# Patient Record
Sex: Female | Born: 1965
Health system: Southern US, Community
[De-identification: ages and names within clinical notes are randomized; demographics above are authoritative.]

## PROBLEM LIST (undated history)

## (undated) DIAGNOSIS — T8859XA Other complications of anesthesia, initial encounter: Secondary | ICD-10-CM

## (undated) DIAGNOSIS — Z9889 Other specified postprocedural states: Secondary | ICD-10-CM

## (undated) DIAGNOSIS — I10 Essential (primary) hypertension: Secondary | ICD-10-CM

## (undated) DIAGNOSIS — T4145XA Adverse effect of unspecified anesthetic, initial encounter: Secondary | ICD-10-CM

## (undated) DIAGNOSIS — F410 Panic disorder [episodic paroxysmal anxiety] without agoraphobia: Secondary | ICD-10-CM

## (undated) DIAGNOSIS — J449 Chronic obstructive pulmonary disease, unspecified: Secondary | ICD-10-CM

## (undated) DIAGNOSIS — R112 Nausea with vomiting, unspecified: Secondary | ICD-10-CM

## (undated) DIAGNOSIS — F419 Anxiety disorder, unspecified: Secondary | ICD-10-CM

## (undated) DIAGNOSIS — M1990 Unspecified osteoarthritis, unspecified site: Secondary | ICD-10-CM

## (undated) DIAGNOSIS — I499 Cardiac arrhythmia, unspecified: Secondary | ICD-10-CM

## (undated) HISTORY — DX: Unspecified osteoarthritis, unspecified site: M19.90

## (undated) HISTORY — PX: ABDOMINAL HYSTERECTOMY: SHX81

## (undated) HISTORY — PX: TONSILLECTOMY: SUR1361

## (undated) HISTORY — DX: Essential (primary) hypertension: I10

## (undated) HISTORY — PX: NECK SURGERY: SHX720

## (undated) HISTORY — PX: BACK SURGERY: SHX140

## (undated) HISTORY — DX: Anxiety disorder, unspecified: F41.9

---
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
## 2004-06-26 ENCOUNTER — Inpatient Hospital Stay: Payer: Self-pay | Admitting: Unknown Physician Specialty

## 2005-10-21 ENCOUNTER — Ambulatory Visit: Payer: Self-pay | Admitting: Unknown Physician Specialty

## 2008-06-21 ENCOUNTER — Ambulatory Visit: Payer: Self-pay | Admitting: Internal Medicine

## 2008-06-25 ENCOUNTER — Emergency Department: Payer: Self-pay | Admitting: Emergency Medicine

## 2008-08-01 ENCOUNTER — Ambulatory Visit: Payer: Self-pay

## 2008-08-22 ENCOUNTER — Ambulatory Visit: Payer: Self-pay | Admitting: Unknown Physician Specialty

## 2008-08-29 ENCOUNTER — Inpatient Hospital Stay: Payer: Self-pay | Admitting: Unknown Physician Specialty

## 2009-03-23 ENCOUNTER — Emergency Department: Payer: Self-pay | Admitting: Unknown Physician Specialty

## 2009-07-18 ENCOUNTER — Ambulatory Visit: Payer: Self-pay

## 2012-04-05 ENCOUNTER — Ambulatory Visit: Payer: Self-pay | Admitting: Obstetrics and Gynecology

## 2012-04-15 ENCOUNTER — Ambulatory Visit: Payer: Self-pay | Admitting: Obstetrics and Gynecology

## 2014-04-12 ENCOUNTER — Ambulatory Visit: Payer: Self-pay | Admitting: Unknown Physician Specialty

## 2014-06-06 ENCOUNTER — Encounter: Payer: Self-pay | Admitting: Surgery

## 2014-06-09 LAB — WOUND AEROBIC CULTURE

## 2014-06-19 ENCOUNTER — Encounter: Payer: Self-pay | Admitting: Surgery

## 2014-11-06 NOTE — Op Note (Signed)
PATIENT NAME:  Rachel Morse, Rachel Morse MR#:  [id number] DATE OF BIRTH:  05-05-1966  DATE OF PROCEDURE:  04/15/2012  PREOPERATIVE DIAGNOSIS: Vaginal inclusion cyst.   POSTOPERATIVE DIAGNOSIS: Vaginal inclusion cyst.   OPERATION PERFORMED: Excision of right vaginal inclusion cyst.   ANESTHESIA USED: General.   SURGEON: Stoney Bang. Georgianne Fick, MD    ESTIMATED BLOOD LOSS: Minimal.   OPERATIVE FLUIDS: 100 mL of Crystalloid.   URINE OUTPUT: None.   ANESTHESIA: General.   ESTIMATED BLOOD LOSS: Minimal.   INTRAOPERATIVE FINDINGS: An approximately 1 cm right vaginal inclusion cyst was noted at the level of the hymenal ring. The inclusion cyst upon incision was noted to contain a small amount of  caseous material which was removed. The cyst wall was excised as well.   SPECIMEN TYPE: Vaginal cyst and cyst material.   COMPLICATIONS: None.   PATIENT CONDITION FOLLOWING THE PROCEDURE: Stable.   PROCEDURE IN DETAIL: Risks, benefits, and alternatives of the procedure were discussed with the patient prior to proceeding to the OR. The patient was taken to the operating room and placed under general endotracheal anesthesia. She was then positioned in the dorsal lithotomy position, prepped and draped in the usual sterile fashion. Following a time-out procedure, attention was turned to the right vaginal sidewall which was able to be visualized by simply retracting the right labia. A stab incision was made above the vaginal inclusion cyst. The cyst material was then shelled out and the vaginal cyst wall was grasped with Adson forceps and also excised using the scalpel. Following this, a single figure-of-eight suture of 2-0 chromic was placed to close the deep layer of the subcutaneous tissues. The skin was then closed using a 4-0 Monocryl in a subcuticular fashion. Sponge, needle, and instrument counts were correct x2. The patient tolerated the procedure well and was taken to the recovery room in stable condition.    ____________________________ Stoney Bang. Georgianne Fick, MD ams:drc D: 04/16/2012 06:50:21 ET T: 04/16/2012 12:04:11 ET JOB#: [id number]  cc: Stoney Bang. Georgianne Fick, MD, <Dictator> Dorthula Nettles MD ELECTRONICALLY SIGNED 04/28/2012 21:02

## 2016-02-24 ENCOUNTER — Ambulatory Visit: Payer: Medicare Other | Admitting: Pain Medicine

## 2016-03-18 ENCOUNTER — Ambulatory Visit: Payer: Medicare Other | Attending: Pain Medicine | Admitting: Pain Medicine

## 2016-03-18 ENCOUNTER — Encounter: Payer: Self-pay | Admitting: Pain Medicine

## 2016-03-18 ENCOUNTER — Telehealth: Payer: Self-pay

## 2016-03-18 VITALS — BP 125/62 | HR 86 | Temp 98.4°F | Resp 20 | Ht 67.0 in | Wt 320.0 lb

## 2016-03-18 DIAGNOSIS — M25559 Pain in unspecified hip: Secondary | ICD-10-CM

## 2016-03-18 DIAGNOSIS — M25562 Pain in left knee: Secondary | ICD-10-CM | POA: Diagnosis not present

## 2016-03-18 DIAGNOSIS — M539 Dorsopathy, unspecified: Secondary | ICD-10-CM

## 2016-03-18 DIAGNOSIS — Z6841 Body Mass Index (BMI) 40.0 and over, adult: Secondary | ICD-10-CM | POA: Diagnosis not present

## 2016-03-18 DIAGNOSIS — F410 Panic disorder [episodic paroxysmal anxiety] without agoraphobia: Secondary | ICD-10-CM

## 2016-03-18 DIAGNOSIS — M25551 Pain in right hip: Secondary | ICD-10-CM | POA: Diagnosis present

## 2016-03-18 DIAGNOSIS — R200 Anesthesia of skin: Secondary | ICD-10-CM | POA: Diagnosis not present

## 2016-03-18 DIAGNOSIS — F1190 Opioid use, unspecified, uncomplicated: Secondary | ICD-10-CM | POA: Diagnosis not present

## 2016-03-18 DIAGNOSIS — M542 Cervicalgia: Secondary | ICD-10-CM | POA: Insufficient documentation

## 2016-03-18 DIAGNOSIS — G8929 Other chronic pain: Secondary | ICD-10-CM | POA: Insufficient documentation

## 2016-03-18 DIAGNOSIS — F411 Generalized anxiety disorder: Secondary | ICD-10-CM

## 2016-03-18 DIAGNOSIS — M25571 Pain in right ankle and joints of right foot: Secondary | ICD-10-CM | POA: Diagnosis not present

## 2016-03-18 DIAGNOSIS — Z79899 Other long term (current) drug therapy: Secondary | ICD-10-CM | POA: Diagnosis not present

## 2016-03-18 DIAGNOSIS — M25552 Pain in left hip: Secondary | ICD-10-CM | POA: Diagnosis present

## 2016-03-18 DIAGNOSIS — M25511 Pain in right shoulder: Secondary | ICD-10-CM | POA: Diagnosis not present

## 2016-03-18 DIAGNOSIS — Z90710 Acquired absence of both cervix and uterus: Secondary | ICD-10-CM | POA: Insufficient documentation

## 2016-03-18 DIAGNOSIS — M79671 Pain in right foot: Secondary | ICD-10-CM | POA: Insufficient documentation

## 2016-03-18 DIAGNOSIS — M47816 Spondylosis without myelopathy or radiculopathy, lumbar region: Secondary | ICD-10-CM | POA: Insufficient documentation

## 2016-03-18 DIAGNOSIS — M5441 Lumbago with sciatica, right side: Secondary | ICD-10-CM

## 2016-03-18 DIAGNOSIS — M25512 Pain in left shoulder: Secondary | ICD-10-CM | POA: Diagnosis not present

## 2016-03-18 DIAGNOSIS — Z0189 Encounter for other specified special examinations: Secondary | ICD-10-CM

## 2016-03-18 DIAGNOSIS — Z79891 Long term (current) use of opiate analgesic: Secondary | ICD-10-CM

## 2016-03-18 DIAGNOSIS — M25572 Pain in left ankle and joints of left foot: Secondary | ICD-10-CM | POA: Insufficient documentation

## 2016-03-18 DIAGNOSIS — M25579 Pain in unspecified ankle and joints of unspecified foot: Secondary | ICD-10-CM

## 2016-03-18 DIAGNOSIS — M533 Sacrococcygeal disorders, not elsewhere classified: Secondary | ICD-10-CM

## 2016-03-18 DIAGNOSIS — M79605 Pain in left leg: Secondary | ICD-10-CM | POA: Diagnosis not present

## 2016-03-18 DIAGNOSIS — R209 Unspecified disturbances of skin sensation: Secondary | ICD-10-CM | POA: Insufficient documentation

## 2016-03-18 DIAGNOSIS — M159 Polyosteoarthritis, unspecified: Secondary | ICD-10-CM | POA: Insufficient documentation

## 2016-03-18 DIAGNOSIS — M545 Low back pain, unspecified: Secondary | ICD-10-CM

## 2016-03-18 DIAGNOSIS — Z1211 Encounter for screening for malignant neoplasm of colon: Secondary | ICD-10-CM | POA: Insufficient documentation

## 2016-03-18 DIAGNOSIS — F17210 Nicotine dependence, cigarettes, uncomplicated: Secondary | ICD-10-CM | POA: Insufficient documentation

## 2016-03-18 DIAGNOSIS — M5442 Lumbago with sciatica, left side: Secondary | ICD-10-CM

## 2016-03-18 DIAGNOSIS — Z981 Arthrodesis status: Secondary | ICD-10-CM | POA: Diagnosis not present

## 2016-03-18 DIAGNOSIS — M79672 Pain in left foot: Secondary | ICD-10-CM | POA: Insufficient documentation

## 2016-03-18 DIAGNOSIS — Y990 Civilian activity done for income or pay: Secondary | ICD-10-CM | POA: Insufficient documentation

## 2016-03-18 DIAGNOSIS — M4802 Spinal stenosis, cervical region: Secondary | ICD-10-CM | POA: Insufficient documentation

## 2016-03-18 DIAGNOSIS — M25561 Pain in right knee: Secondary | ICD-10-CM | POA: Insufficient documentation

## 2016-03-18 DIAGNOSIS — M150 Primary generalized (osteo)arthritis: Secondary | ICD-10-CM

## 2016-03-18 DIAGNOSIS — M47812 Spondylosis without myelopathy or radiculopathy, cervical region: Secondary | ICD-10-CM

## 2016-03-18 DIAGNOSIS — R937 Abnormal findings on diagnostic imaging of other parts of musculoskeletal system: Secondary | ICD-10-CM

## 2016-03-18 DIAGNOSIS — M79604 Pain in right leg: Secondary | ICD-10-CM | POA: Diagnosis not present

## 2016-03-18 DIAGNOSIS — I10 Essential (primary) hypertension: Secondary | ICD-10-CM | POA: Insufficient documentation

## 2016-03-18 DIAGNOSIS — F4310 Post-traumatic stress disorder, unspecified: Secondary | ICD-10-CM

## 2016-03-18 DIAGNOSIS — M79606 Pain in leg, unspecified: Secondary | ICD-10-CM

## 2016-03-18 DIAGNOSIS — R0602 Shortness of breath: Secondary | ICD-10-CM

## 2016-03-18 DIAGNOSIS — Z5181 Encounter for therapeutic drug level monitoring: Secondary | ICD-10-CM

## 2016-03-18 DIAGNOSIS — Z9889 Other specified postprocedural states: Secondary | ICD-10-CM

## 2016-03-18 DIAGNOSIS — M79673 Pain in unspecified foot: Secondary | ICD-10-CM

## 2016-03-18 DIAGNOSIS — R5381 Other malaise: Secondary | ICD-10-CM

## 2016-03-18 DIAGNOSIS — Z87828 Personal history of other (healed) physical injury and trauma: Secondary | ICD-10-CM

## 2016-03-18 DIAGNOSIS — F329 Major depressive disorder, single episode, unspecified: Secondary | ICD-10-CM | POA: Insufficient documentation

## 2016-03-18 DIAGNOSIS — M961 Postlaminectomy syndrome, not elsewhere classified: Secondary | ICD-10-CM | POA: Insufficient documentation

## 2016-03-18 DIAGNOSIS — M5030 Other cervical disc degeneration, unspecified cervical region: Secondary | ICD-10-CM | POA: Insufficient documentation

## 2016-03-18 NOTE — Telephone Encounter (Signed)
[name] from [facility] [phone] wants to know a plan for pt. And get office visit notes once available.

## 2016-03-18 NOTE — Telephone Encounter (Signed)
Explained Dr. Adalberto Cole procedure for prescribing narcotics to new patients to staff at [facility]. Notes faxed.

## 2016-03-18 NOTE — Progress Notes (Signed)
Safety precautions to be maintained throughout the outpatient stay will include: orient to surroundings, keep bed in low position, maintain call bell within reach at all times, provide assistance with transfer out of bed and ambulation.  
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
## 2016-03-18 NOTE — Patient Instructions (Signed)
INSTRUCTED TO GET XRAYS AND LABS DRAWN AT THE [facility] TODAY OR ASAP.

## 2016-03-31 ENCOUNTER — Encounter: Payer: Self-pay | Admitting: Pain Medicine

## 2016-03-31 DIAGNOSIS — F1290 Cannabis use, unspecified, uncomplicated: Secondary | ICD-10-CM | POA: Insufficient documentation

## 2016-03-31 LAB — COMPLIANCE DRUG ANALYSIS, UR

## 2016-03-31 NOTE — Progress Notes (Signed)
NOTE: This forensic urine drug screen (UDS) test was conducted using a state-of-the-art ultra high performance liquid chromatography and mass spectrometry system (UPLC/MS-MS), the most sophisticated and accurate method available. UPLC/MS-MS is 1,000 times more precise and accurate than standard gas chromatography and mass spectrometry (GC/MS). This system can analyze 26 drug categories and 180 drug compounds.  This patient's UDT results were (+) for Cannabinoids. The patient has been informed of our "Zero Tolerance" towards the use of any illegal substances. Because the patient did disclose the use of cannabinoids at the time of collection, we will not discontinue the posibility of using of controlled substances, but we will limit them contingent on the patient's discontinuation of all illegal substances. We will also limit the interval between refills to no more that 30 days, with frequent unanounced UDTs. As long as the quantitative levels are seen to be decreasing, we will continue therapy. The minute they increase or remain the same, we will discontinue the opioid management option.

## 2016-05-07 ENCOUNTER — Telehealth: Payer: Self-pay

## 2016-05-07 NOTE — Telephone Encounter (Signed)
Patient called and instructed that she needs to have [facility] call us and confirm appt and medications.

## 2016-05-07 NOTE — Telephone Encounter (Signed)
She has new patient appt on 08-10-16.. Can you call [facility] and let them know that she is not getting medicine here at least until then, so they will keep filling her pain meds till she gets in here.

## 2016-06-17 ENCOUNTER — Ambulatory Visit: Payer: Medicare Other | Admitting: Pain Medicine

## 2016-08-10 ENCOUNTER — Ambulatory Visit: Payer: Medicare Other | Admitting: Pain Medicine

## 2016-08-10 DIAGNOSIS — G894 Chronic pain syndrome: Secondary | ICD-10-CM | POA: Insufficient documentation
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
## 2016-08-11 ENCOUNTER — Ambulatory Visit: Payer: Medicare Other | Attending: Pain Medicine | Admitting: Pain Medicine

## 2016-08-11 ENCOUNTER — Encounter (INDEPENDENT_AMBULATORY_CARE_PROVIDER_SITE_OTHER): Payer: Self-pay

## 2016-08-11 ENCOUNTER — Encounter: Payer: Self-pay | Admitting: Pain Medicine

## 2016-08-11 VITALS — BP 157/94 | HR 74 | Temp 98.4°F | Resp 18 | Ht 67.0 in | Wt 320.0 lb

## 2016-08-11 DIAGNOSIS — M79605 Pain in left leg: Secondary | ICD-10-CM

## 2016-08-11 DIAGNOSIS — G894 Chronic pain syndrome: Secondary | ICD-10-CM | POA: Diagnosis not present

## 2016-08-11 DIAGNOSIS — M542 Cervicalgia: Secondary | ICD-10-CM | POA: Diagnosis not present

## 2016-08-11 DIAGNOSIS — M25511 Pain in right shoulder: Secondary | ICD-10-CM | POA: Insufficient documentation

## 2016-08-11 DIAGNOSIS — Z79891 Long term (current) use of opiate analgesic: Secondary | ICD-10-CM | POA: Diagnosis not present

## 2016-08-11 DIAGNOSIS — M159 Polyosteoarthritis, unspecified: Secondary | ICD-10-CM

## 2016-08-11 DIAGNOSIS — M79604 Pain in right leg: Secondary | ICD-10-CM

## 2016-08-11 DIAGNOSIS — M1991 Primary osteoarthritis, unspecified site: Secondary | ICD-10-CM | POA: Insufficient documentation

## 2016-08-11 DIAGNOSIS — M47816 Spondylosis without myelopathy or radiculopathy, lumbar region: Secondary | ICD-10-CM

## 2016-08-11 DIAGNOSIS — M25561 Pain in right knee: Secondary | ICD-10-CM | POA: Insufficient documentation

## 2016-08-11 DIAGNOSIS — Z5189 Encounter for other specified aftercare: Secondary | ICD-10-CM | POA: Diagnosis present

## 2016-08-11 DIAGNOSIS — Z9889 Other specified postprocedural states: Secondary | ICD-10-CM

## 2016-08-11 DIAGNOSIS — M25562 Pain in left knee: Secondary | ICD-10-CM | POA: Insufficient documentation

## 2016-08-11 DIAGNOSIS — M961 Postlaminectomy syndrome, not elsewhere classified: Secondary | ICD-10-CM

## 2016-08-11 DIAGNOSIS — F1290 Cannabis use, unspecified, uncomplicated: Secondary | ICD-10-CM

## 2016-08-11 DIAGNOSIS — M25512 Pain in left shoulder: Secondary | ICD-10-CM | POA: Diagnosis not present

## 2016-08-11 DIAGNOSIS — M1288 Other specific arthropathies, not elsewhere classified, other specified site: Secondary | ICD-10-CM

## 2016-08-11 DIAGNOSIS — F1190 Opioid use, unspecified, uncomplicated: Secondary | ICD-10-CM

## 2016-08-11 DIAGNOSIS — M5442 Lumbago with sciatica, left side: Secondary | ICD-10-CM | POA: Diagnosis not present

## 2016-08-11 DIAGNOSIS — M25559 Pain in unspecified hip: Secondary | ICD-10-CM

## 2016-08-11 DIAGNOSIS — F1390 Sedative, hypnotic, or anxiolytic use, unspecified, uncomplicated: Secondary | ICD-10-CM | POA: Diagnosis not present

## 2016-08-11 DIAGNOSIS — M533 Sacrococcygeal disorders, not elsewhere classified: Secondary | ICD-10-CM | POA: Diagnosis not present

## 2016-08-11 DIAGNOSIS — M150 Primary generalized (osteo)arthritis: Secondary | ICD-10-CM

## 2016-08-11 DIAGNOSIS — Z79899 Other long term (current) drug therapy: Secondary | ICD-10-CM

## 2016-08-11 DIAGNOSIS — M5441 Lumbago with sciatica, right side: Secondary | ICD-10-CM

## 2016-08-11 DIAGNOSIS — G8929 Other chronic pain: Secondary | ICD-10-CM

## 2017-02-25 ENCOUNTER — Other Ambulatory Visit: Payer: Self-pay | Admitting: Nurse Practitioner

## 2017-02-25 DIAGNOSIS — Z1239 Encounter for other screening for malignant neoplasm of breast: Secondary | ICD-10-CM

## 2017-12-28 ENCOUNTER — Emergency Department
Admission: EM | Admit: 2017-12-28 | Discharge: 2017-12-28 | Disposition: A | Discharge status: Home / Self Care | Payer: Medicare Other | Attending: Emergency Medicine | Admitting: Emergency Medicine

## 2017-12-28 ENCOUNTER — Other Ambulatory Visit: Payer: Self-pay

## 2017-12-28 ENCOUNTER — Emergency Department: Payer: Medicare Other

## 2017-12-28 ENCOUNTER — Encounter: Payer: Self-pay | Admitting: Emergency Medicine

## 2017-12-28 DIAGNOSIS — I10 Essential (primary) hypertension: Secondary | ICD-10-CM | POA: Insufficient documentation

## 2017-12-28 DIAGNOSIS — R0789 Other chest pain: Secondary | ICD-10-CM | POA: Insufficient documentation

## 2017-12-28 DIAGNOSIS — R0981 Nasal congestion: Secondary | ICD-10-CM | POA: Diagnosis not present

## 2017-12-28 DIAGNOSIS — R05 Cough: Secondary | ICD-10-CM

## 2017-12-28 DIAGNOSIS — J449 Chronic obstructive pulmonary disease, unspecified: Secondary | ICD-10-CM | POA: Insufficient documentation

## 2017-12-28 DIAGNOSIS — R0602 Shortness of breath: Secondary | ICD-10-CM | POA: Diagnosis present

## 2017-12-28 DIAGNOSIS — J189 Pneumonia, unspecified organism: Secondary | ICD-10-CM

## 2017-12-28 DIAGNOSIS — F17210 Nicotine dependence, cigarettes, uncomplicated: Secondary | ICD-10-CM | POA: Diagnosis not present

## 2017-12-28 DIAGNOSIS — R059 Cough, unspecified: Secondary | ICD-10-CM

## 2017-12-28 LAB — BASIC METABOLIC PANEL
ANION GAP: 10 (ref 5–15)
BUN: 14 mg/dL (ref 6–20)
CHLORIDE: 102 mmol/L (ref 101–111)
CO2: 25 mmol/L (ref 22–32)
Calcium: 9.4 mg/dL (ref 8.9–10.3)
Creatinine, Ser: 0.85 mg/dL (ref 0.44–1.00)
GFR calc non Af Amer: >60 mL/min (ref >60)
Glucose, Bld: 116 mg/dL — ABNORMAL HIGH (ref 65–99)
Potassium: 3.6 mmol/L (ref 3.5–5.1)
Sodium: 137 mmol/L (ref 135–145)

## 2017-12-28 LAB — CBC
HEMATOCRIT: 44.7 % (ref 35.0–47.0)
HEMOGLOBIN: 15.6 g/dL (ref 12.0–16.0)
MCH: 31.7 pg (ref 26.0–34.0)
MCHC: 34.8 g/dL (ref 32.0–36.0)
MCV: 91.1 fL (ref 80.0–100.0)
Platelets: 325 10*3/uL (ref 150–440)
RBC: 4.9 MIL/uL (ref 3.80–5.20)
RDW: 13.9 % (ref 11.5–14.5)
WBC: 15 10*3/uL — ABNORMAL HIGH (ref 3.6–11.0)

## 2017-12-28 LAB — TROPONIN I: Troponin I: <0.03 ng/mL (ref <0.03)

## 2017-12-28 MED ORDER — DOXYCYCLINE HYCLATE 100 MG PO CAPS: ORAL_CAPSULE | ORAL | 0 refills | Status: DC

## 2017-12-28 MED ORDER — IPRATROPIUM-ALBUTEROL 0.5-2.5 (3) MG/3ML IN SOLN
3.0000 mL | Freq: Once | RESPIRATORY_TRACT | Status: AC
  Administered 2017-12-28: 3 mL via RESPIRATORY_TRACT
  Filled 2017-12-28: qty 3

## 2017-12-28 MED ORDER — ALBUTEROL SULFATE (2.5 MG/3ML) 0.083% IN NEBU
5.0000 mg | INHALATION_SOLUTION | Freq: Once | RESPIRATORY_TRACT | Status: AC
  Administered 2017-12-28: 5 mg via RESPIRATORY_TRACT
  Filled 2017-12-28: qty 6

## 2017-12-28 MED ORDER — DOXYCYCLINE HYCLATE 100 MG PO TABS
100.0000 mg | ORAL_TABLET | Freq: Once | ORAL | Status: AC
  Administered 2017-12-28: 100 mg via ORAL
  Filled 2017-12-28: qty 1

## 2017-12-28 MED ORDER — ACETAMINOPHEN 325 MG PO TABS: 650.0000 mg | ORAL_TABLET | Freq: Once | ORAL | Status: DC | PRN

## 2017-12-28 MED ORDER — ALBUTEROL SULFATE HFA 108 (90 BASE) MCG/ACT IN AERS: INHALATION_SPRAY | RESPIRATORY_TRACT | 1 refills | Status: AC

## 2017-12-28 MED ORDER — DEXAMETHASONE 10 MG/ML FOR PEDIATRIC ORAL USE
10.0000 mg | Freq: Once | INTRAMUSCULAR | Status: AC
  Administered 2017-12-28: 10 mg via ORAL
  Filled 2017-12-28: qty 1

## 2017-12-28 MED ORDER — DEXAMETHASONE SODIUM PHOSPHATE 10 MG/ML IJ SOLN
INTRAMUSCULAR | Status: AC
  Administered 2017-12-28: 10 mg via ORAL
  Filled 2017-12-28: qty 1

## 2017-12-28 MED ORDER — HYDROCODONE-HOMATROPINE 5-1.5 MG/5ML PO SYRP: 5.0000 mL | ORAL_SOLUTION | Freq: Four times a day (QID) | ORAL | 0 refills | Status: DC | PRN

## 2017-12-28 NOTE — ED Triage Notes (Signed)
Patient presents to the ED with shortness of breath x 1 week with cough and congestion.  Patient states prior to cough she was having nausea and vomiting with diarrhea, that ended approx. 4 days ago.  Patient states, "at home, I felt like, If I kept laying there, I was going to die."  Patient is sitting calmly in triage.  Respirations even and not labored at this time.
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
## 2017-12-28 NOTE — Discharge Instructions (Signed)

## 2018-07-11 ENCOUNTER — Emergency Department: Payer: Medicare Other

## 2018-07-11 ENCOUNTER — Other Ambulatory Visit: Payer: Self-pay

## 2018-07-11 ENCOUNTER — Emergency Department
Admission: EM | Admit: 2018-07-11 | Discharge: 2018-07-11 | Disposition: A | Discharge status: Home / Self Care | Payer: Medicare Other | Attending: Emergency Medicine | Admitting: Emergency Medicine

## 2018-07-11 DIAGNOSIS — Y998 Other external cause status: Secondary | ICD-10-CM | POA: Insufficient documentation

## 2018-07-11 DIAGNOSIS — S99921A Unspecified injury of right foot, initial encounter: Secondary | ICD-10-CM | POA: Diagnosis present

## 2018-07-11 DIAGNOSIS — S9031XA Contusion of right foot, initial encounter: Secondary | ICD-10-CM | POA: Insufficient documentation

## 2018-07-11 DIAGNOSIS — I10 Essential (primary) hypertension: Secondary | ICD-10-CM | POA: Diagnosis not present

## 2018-07-11 DIAGNOSIS — Y929 Unspecified place or not applicable: Secondary | ICD-10-CM | POA: Diagnosis not present

## 2018-07-11 DIAGNOSIS — W208XXA Other cause of strike by thrown, projected or falling object, initial encounter: Secondary | ICD-10-CM | POA: Insufficient documentation

## 2018-07-11 DIAGNOSIS — F17210 Nicotine dependence, cigarettes, uncomplicated: Secondary | ICD-10-CM | POA: Insufficient documentation

## 2018-07-11 DIAGNOSIS — Y9389 Activity, other specified: Secondary | ICD-10-CM | POA: Insufficient documentation

## 2018-07-11 MED ORDER — MELOXICAM 15 MG PO TABS: 15.0000 mg | ORAL_TABLET | Freq: Every day | ORAL | 0 refills | Status: DC

## 2018-07-11 MED ORDER — OXYCODONE-ACETAMINOPHEN 5-325 MG PO TABS
1.0000 | ORAL_TABLET | Freq: Once | ORAL | Status: AC
  Administered 2018-07-11: 1 via ORAL
  Filled 2018-07-11: qty 1

## 2018-07-11 MED ORDER — ONDANSETRON 8 MG PO TBDP
8.0000 mg | ORAL_TABLET | Freq: Once | ORAL | Status: AC
  Administered 2018-07-11: 8 mg via ORAL
  Filled 2018-07-11: qty 1
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
## 2018-07-11 NOTE — ED Notes (Signed)
Large hematoma noted to top of R foot; pulse 2+.

## 2018-07-11 NOTE — ED Notes (Signed)
[place] Engineer, structural at bedside to talk with pt.

## 2019-01-08 ENCOUNTER — Other Ambulatory Visit: Payer: Self-pay

## 2019-01-08 ENCOUNTER — Encounter: Payer: Self-pay | Admitting: *Deleted

## 2019-01-08 ENCOUNTER — Emergency Department: Payer: Medicare Other

## 2019-01-08 ENCOUNTER — Emergency Department
Admission: EM | Admit: 2019-01-08 | Discharge: 2019-01-08 | Disposition: A | Discharge status: Home / Self Care | Payer: Medicare Other | Attending: Emergency Medicine | Admitting: Emergency Medicine

## 2019-01-08 DIAGNOSIS — F17210 Nicotine dependence, cigarettes, uncomplicated: Secondary | ICD-10-CM | POA: Insufficient documentation

## 2019-01-08 DIAGNOSIS — I10 Essential (primary) hypertension: Secondary | ICD-10-CM | POA: Diagnosis not present

## 2019-01-08 DIAGNOSIS — M5412 Radiculopathy, cervical region: Secondary | ICD-10-CM | POA: Insufficient documentation

## 2019-01-08 DIAGNOSIS — M542 Cervicalgia: Secondary | ICD-10-CM | POA: Diagnosis present

## 2019-01-08 LAB — BASIC METABOLIC PANEL
Anion gap: 11 (ref 5–15)
BUN: 19 mg/dL (ref 6–20)
CO2: 25 mmol/L (ref 22–32)
Calcium: 9 mg/dL (ref 8.9–10.3)
Chloride: 101 mmol/L (ref 98–111)
Creatinine, Ser: 0.91 mg/dL (ref 0.44–1.00)
GFR calc Af Amer: >60 mL/min (ref >60)
GFR calc non Af Amer: >60 mL/min (ref >60)
Glucose, Bld: 120 mg/dL — ABNORMAL HIGH (ref 70–99)
Potassium: 3.4 mmol/L — ABNORMAL LOW (ref 3.5–5.1)
Sodium: 137 mmol/L (ref 135–145)

## 2019-01-08 LAB — CBC
HCT: 44.4 % (ref 36.0–46.0)
Hemoglobin: 14.8 g/dL (ref 12.0–15.0)
MCH: 29.8 pg (ref 26.0–34.0)
MCHC: 33.3 g/dL (ref 30.0–36.0)
MCV: 89.3 fL (ref 80.0–100.0)
Platelets: 285 10*3/uL (ref 150–400)
RBC: 4.97 MIL/uL (ref 3.87–5.11)
RDW: 12.8 % (ref 11.5–15.5)
WBC: 10.7 10*3/uL — ABNORMAL HIGH (ref 4.0–10.5)
nRBC: 0 % (ref 0.0–0.2)

## 2019-01-08 LAB — TROPONIN I: Troponin I: <0.03 ng/mL (ref <0.03)

## 2019-01-08 MED ORDER — BACLOFEN 10 MG PO TABS: 10.0000 mg | ORAL_TABLET | Freq: Three times a day (TID) | ORAL | 0 refills | Status: DC

## 2019-01-08 MED ORDER — PREDNISONE 10 MG (21) PO TBPK: ORAL_TABLET | ORAL | 0 refills | Status: DC

## 2019-01-08 MED ORDER — HYDROMORPHONE HCL 1 MG/ML IJ SOLN
1.0000 mg | Freq: Once | INTRAMUSCULAR | Status: AC
  Administered 2019-01-08: 1 mg via INTRAVENOUS
  Filled 2019-01-08: qty 1

## 2019-01-08 MED ORDER — SODIUM CHLORIDE 0.9% FLUSH: 3.0000 mL | Freq: Once | INTRAVENOUS | Status: DC

## 2019-01-08 NOTE — ED Triage Notes (Signed)
First Nurse Note:  C/O right sided neck, shoulder and arm pain x 1 week with fingers feeling numb at times.  AAOx3.  Skin warm and dry. MAE equally and strong.  NAD

## 2019-01-08 NOTE — ED Notes (Signed)
Pt verbalized understanding of discharge instructions. NAD at this time. 

## 2019-01-08 NOTE — ED Provider Notes (Signed)
Patient seen and examined by me.  Clinically she likely has a cervical radiculopathy involving the right arm.  She is very tender in and around the right neck musculature.  Anticipate discharge with outpatient referral.   Earleen Newport, MD 01/08/19 1728

## 2019-01-08 NOTE — Discharge Instructions (Addendum)
Follow-up with your regular doctor or Dr. Lacinda Axon at [facility] for evaluation.  Take medication as prescribed.  Return emergency department worsening.

## 2019-01-08 NOTE — ED Triage Notes (Signed)
Pt to ED reporting right sided neck, shoulder and arm pain with intermittent numbness in her right hand and fingers. Pt able to move right side without difficulty but reports movement increases the pain slightly. No injury recently.
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
## 2019-01-18 ENCOUNTER — Other Ambulatory Visit: Payer: Self-pay | Admitting: Student

## 2019-01-18 DIAGNOSIS — M5412 Radiculopathy, cervical region: Secondary | ICD-10-CM

## 2019-01-30 ENCOUNTER — Ambulatory Visit
Admission: RE | Admit: 2019-01-30 | Discharge: 2019-01-30 | Disposition: A | Discharge status: Home / Self Care | Payer: Medicare Other | Source: Ambulatory Visit | Attending: Student | Admitting: Student

## 2019-01-30 ENCOUNTER — Other Ambulatory Visit: Payer: Self-pay

## 2019-01-30 DIAGNOSIS — M5412 Radiculopathy, cervical region: Secondary | ICD-10-CM | POA: Insufficient documentation

## 2019-01-31 ENCOUNTER — Other Ambulatory Visit: Payer: Self-pay

## 2019-01-31 ENCOUNTER — Encounter: Payer: Self-pay | Admitting: Emergency Medicine

## 2019-01-31 DIAGNOSIS — Z5321 Procedure and treatment not carried out due to patient leaving prior to being seen by health care provider: Secondary | ICD-10-CM | POA: Insufficient documentation

## 2019-01-31 DIAGNOSIS — M542 Cervicalgia: Secondary | ICD-10-CM | POA: Diagnosis present

## 2019-01-31 NOTE — ED Triage Notes (Signed)
Patient ambulatory to triage with steady gait, without difficulty or distress noted, mask in place; pt reports she has been dx with pinched nerve in neck; had MRI yesterday; c/o persistent pain to rt side neck with movement that radiates down arm and into fingers; denies any accomp symptoms; taking gabapentin & percocet without relief

## 2019-02-01 ENCOUNTER — Emergency Department
Admission: EM | Admit: 2019-02-01 | Discharge: 2019-02-01 | Discharge status: Against Medical Advice | Payer: Medicare Other | Attending: Emergency Medicine | Admitting: Emergency Medicine

## 2019-02-01 NOTE — ED Notes (Signed)
No answer when called several times from lobby 

## 2019-03-01 ENCOUNTER — Other Ambulatory Visit: Payer: Self-pay | Admitting: Neurosurgery

## 2019-03-08 ENCOUNTER — Encounter
Admission: RE | Admit: 2019-03-08 | Discharge: 2019-03-08 | Disposition: A | Discharge status: Home / Self Care | Payer: Medicare Other | Source: Ambulatory Visit | Attending: Neurosurgery | Admitting: Neurosurgery

## 2019-03-08 ENCOUNTER — Other Ambulatory Visit: Payer: Self-pay

## 2019-03-08 DIAGNOSIS — Z01812 Encounter for preprocedural laboratory examination: Secondary | ICD-10-CM | POA: Diagnosis not present

## 2019-03-08 DIAGNOSIS — Z20828 Contact with and (suspected) exposure to other viral communicable diseases: Secondary | ICD-10-CM | POA: Insufficient documentation

## 2019-03-08 HISTORY — DX: Other complications of anesthesia, initial encounter: T88.59XA

## 2019-03-08 HISTORY — DX: Nausea with vomiting, unspecified: R11.2

## 2019-03-08 HISTORY — DX: Chronic obstructive pulmonary disease, unspecified: J44.9

## 2019-03-08 HISTORY — DX: Other specified postprocedural states: Z98.890

## 2019-03-08 HISTORY — DX: Panic disorder (episodic paroxysmal anxiety): F41.0

## 2019-03-08 HISTORY — DX: Cardiac arrhythmia, unspecified: I49.9

## 2019-03-08 LAB — BASIC METABOLIC PANEL
Anion gap: 11 (ref 5–15)
BUN: 25 mg/dL — ABNORMAL HIGH (ref 6–20)
CO2: 22 mmol/L (ref 22–32)
Calcium: 9.1 mg/dL (ref 8.9–10.3)
Chloride: 102 mmol/L (ref 98–111)
Creatinine, Ser: 0.99 mg/dL (ref 0.44–1.00)
GFR calc Af Amer: >60 mL/min (ref >60)
GFR calc non Af Amer: >60 mL/min (ref >60)
Glucose, Bld: 105 mg/dL — ABNORMAL HIGH (ref 70–99)
Potassium: 3.9 mmol/L (ref 3.5–5.1)
Sodium: 135 mmol/L (ref 135–145)

## 2019-03-08 LAB — CBC
HCT: 43.4 % (ref 36.0–46.0)
Hemoglobin: 14.6 g/dL (ref 12.0–15.0)
MCH: 30.5 pg (ref 26.0–34.0)
MCHC: 33.6 g/dL (ref 30.0–36.0)
MCV: 90.6 fL (ref 80.0–100.0)
Platelets: 287 10*3/uL (ref 150–400)
RBC: 4.79 MIL/uL (ref 3.87–5.11)
RDW: 12.9 % (ref 11.5–15.5)
WBC: 9 10*3/uL (ref 4.0–10.5)
nRBC: 0 % (ref 0.0–0.2)

## 2019-03-08 LAB — PROTIME-INR
INR: 1.1 (ref 0.8–1.2)
Prothrombin Time: 13.9 seconds (ref 11.4–15.2)

## 2019-03-08 LAB — TYPE AND SCREEN
ABO/RH(D): O POS
Antibody Screen: NEGATIVE

## 2019-03-08 LAB — URINALYSIS, ROUTINE W REFLEX MICROSCOPIC
Bilirubin Urine: NEGATIVE
Glucose, UA: NEGATIVE mg/dL
Ketones, ur: NEGATIVE mg/dL
Nitrite: POSITIVE — AB
Protein, ur: NEGATIVE mg/dL
Specific Gravity, Urine: 1.018 (ref 1.005–1.030)
WBC, UA: >50 WBC/hpf — ABNORMAL HIGH (ref 0–5)
pH: 5 (ref 5.0–8.0)

## 2019-03-08 LAB — SURGICAL PCR SCREEN
MRSA, PCR: NEGATIVE
Staphylococcus aureus: POSITIVE — AB

## 2019-03-08 LAB — APTT: aPTT: 34 seconds (ref 24–36)

## 2019-03-08 NOTE — Patient Instructions (Signed)
Your procedure is scheduled on: 03/13/19 Report to Day Surgery.[facility]OND FLOOR To find out your arrival time please call [phone] between 1PM - 3PM on 03/10/19.  Remember: Instructions that are not followed completely may result in serious medical risk,  up to and including death, or upon the discretion of your surgeon and anesthesiologist your  surgery may need to be rescheduled.     _X__ 1. Do not eat food after midnight the night before your procedure.                 No gum chewing or hard candies. You may drink clear liquids up to 2 hours                 before you are scheduled to arrive for your surgery- DO not drink clear                 liquids within 2 hours of the start of your surgery.                 Clear Liquids include:  water, apple juice without pulp, clear carbohydrate                 drink such as Clearfast of Gatorade, Black Coffee or Tea (Do not add                 anything to coffee or tea).  __X__2.  On the morning of surgery brush your teeth with toothpaste and water, you                may rinse your mouth with mouthwash if you wish.  Do not swallow any toothpaste of mouthwash.     _X__ 3.  No Alcohol for 24 hours before or after surgery.   _X__ 4.  Do Not Smoke or use e-cigarettes For 24 Hours Prior to Your Surgery.                 Do not use any chewable tobacco products for at least 6 hours prior to                 surgery.  ____  5.  Bring all medications with you on the day of surgery if instructed.   _X___  6.  Notify your doctor if there is any change in your medical condition      (cold, fever, infections).     Do not wear jewelry, make-up, hairpins, clips or nail polish. Do not wear lotions, powders, or perfumes. You may wear deodorant. Do not shave 48 hours prior to surgery. Men may shave face and neck. Do not bring valuables to the hospital.    [facility] is not responsible for any belongings or  valuables.  Contacts, dentures or bridgework may not be worn into surgery. Leave your suitcase in the car. After surgery it may be brought to your room. For patients admitted to the hospital, discharge time is determined by your treatment team.   Patients discharged the day of surgery will not be allowed to drive home.   Please read over the following fact sheets that you were given:   Surgical Site Infection Prevention / MRSA/SPINAL BOOKLET         _X___ Take these medicines the morning of surgery with A SIP OF WATER:    1. ALPRAZOLAM  2. AMLODIPINE  3. ABILIFY  4.LEVOTHYROXINE  5.  6.  ____ Fleet Enema (as directed)  __X__ Use CHG Soap as directed  __X__ Use inhalers on the day of surgery AND BRING  ____ Stop metformin 2 days prior to surgery    ____ Take 1/2 of usual insulin dose the night before surgery. No insulin the morning          of surgery.   ____ Stop Coumadin/Plavix/aspirin on   ____ Stop Anti-inflammatories on    ____ Stop supplements until after surgery.    ____ Bring C-Pap to the hospital.

## 2019-03-09 ENCOUNTER — Other Ambulatory Visit
Admission: RE | Admit: 2019-03-09 | Discharge: 2019-03-09 | Disposition: A | Discharge status: Home / Self Care | Payer: Medicare Other | Source: Ambulatory Visit | Attending: Neurosurgery | Admitting: Neurosurgery

## 2019-03-09 DIAGNOSIS — Z01812 Encounter for preprocedural laboratory examination: Secondary | ICD-10-CM | POA: Diagnosis not present

## 2019-03-09 LAB — SARS CORONAVIRUS 2 (TAT 6-24 HRS): SARS Coronavirus 2: NEGATIVE

## 2019-03-13 ENCOUNTER — Ambulatory Visit: Payer: Medicare Other | Admitting: Certified Registered"

## 2019-03-13 ENCOUNTER — Observation Stay
Admission: RE | Admit: 2019-03-13 | Discharge: 2019-03-14 | Disposition: A | Discharge status: Home / Self Care | Payer: Medicare Other | Attending: Neurosurgery | Admitting: Neurosurgery

## 2019-03-13 ENCOUNTER — Encounter: Payer: Self-pay | Admitting: *Deleted

## 2019-03-13 ENCOUNTER — Other Ambulatory Visit: Payer: Self-pay

## 2019-03-13 ENCOUNTER — Encounter
Admission: RE | Disposition: A | Discharge status: Home / Self Care | Payer: Self-pay | Source: Home / Self Care | Attending: Neurosurgery

## 2019-03-13 ENCOUNTER — Ambulatory Visit: Payer: Medicare Other

## 2019-03-13 DIAGNOSIS — Z79899 Other long term (current) drug therapy: Secondary | ICD-10-CM | POA: Diagnosis not present

## 2019-03-13 DIAGNOSIS — Z981 Arthrodesis status: Secondary | ICD-10-CM

## 2019-03-13 DIAGNOSIS — Z91048 Other nonmedicinal substance allergy status: Secondary | ICD-10-CM | POA: Diagnosis not present

## 2019-03-13 DIAGNOSIS — Z91041 Radiographic dye allergy status: Secondary | ICD-10-CM | POA: Insufficient documentation

## 2019-03-13 DIAGNOSIS — Z825 Family history of asthma and other chronic lower respiratory diseases: Secondary | ICD-10-CM | POA: Insufficient documentation

## 2019-03-13 DIAGNOSIS — I10 Essential (primary) hypertension: Secondary | ICD-10-CM | POA: Insufficient documentation

## 2019-03-13 DIAGNOSIS — J449 Chronic obstructive pulmonary disease, unspecified: Secondary | ICD-10-CM | POA: Insufficient documentation

## 2019-03-13 DIAGNOSIS — Z7951 Long term (current) use of inhaled steroids: Secondary | ICD-10-CM | POA: Diagnosis not present

## 2019-03-13 DIAGNOSIS — F410 Panic disorder [episodic paroxysmal anxiety] without agoraphobia: Secondary | ICD-10-CM | POA: Insufficient documentation

## 2019-03-13 DIAGNOSIS — Z823 Family history of stroke: Secondary | ICD-10-CM | POA: Diagnosis not present

## 2019-03-13 DIAGNOSIS — E669 Obesity, unspecified: Secondary | ICD-10-CM | POA: Insufficient documentation

## 2019-03-13 DIAGNOSIS — M1990 Unspecified osteoarthritis, unspecified site: Secondary | ICD-10-CM | POA: Insufficient documentation

## 2019-03-13 DIAGNOSIS — Z419 Encounter for procedure for purposes other than remedying health state, unspecified: Secondary | ICD-10-CM

## 2019-03-13 DIAGNOSIS — M2578 Osteophyte, vertebrae: Secondary | ICD-10-CM | POA: Diagnosis not present

## 2019-03-13 DIAGNOSIS — M50122 Cervical disc disorder at C5-C6 level with radiculopathy: Principal | ICD-10-CM | POA: Insufficient documentation

## 2019-03-13 DIAGNOSIS — Z8249 Family history of ischemic heart disease and other diseases of the circulatory system: Secondary | ICD-10-CM | POA: Insufficient documentation

## 2019-03-13 DIAGNOSIS — Z87891 Personal history of nicotine dependence: Secondary | ICD-10-CM | POA: Insufficient documentation

## 2019-03-13 DIAGNOSIS — Z90710 Acquired absence of both cervix and uterus: Secondary | ICD-10-CM | POA: Diagnosis not present

## 2019-03-13 DIAGNOSIS — Z6841 Body Mass Index (BMI) 40.0 and over, adult: Secondary | ICD-10-CM | POA: Diagnosis not present

## 2019-03-13 DIAGNOSIS — F419 Anxiety disorder, unspecified: Secondary | ICD-10-CM | POA: Diagnosis not present

## 2019-03-13 DIAGNOSIS — M5412 Radiculopathy, cervical region: Secondary | ICD-10-CM | POA: Diagnosis present

## 2019-03-13 HISTORY — PX: ANTERIOR CERVICAL DECOMP/DISCECTOMY FUSION: SHX1161

## 2019-03-13 LAB — ABO/RH: ABO/RH(D): O POS

## 2019-03-13 SURGERY — ANTERIOR CERVICAL DECOMPRESSION/DISCECTOMY FUSION 1 LEVEL
Anesthesia: General | Site: Neck

## 2019-03-13 MED ORDER — MIDAZOLAM HCL 2 MG/2ML IJ SOLN
INTRAMUSCULAR | Status: AC
  Filled 2019-03-13: qty 2

## 2019-03-13 MED ORDER — EPHEDRINE SULFATE 50 MG/ML IJ SOLN
INTRAMUSCULAR | Status: DC | PRN
  Administered 2019-03-13 (×3): 5 mg via INTRAVENOUS

## 2019-03-13 MED ORDER — GLYCOPYRROLATE 0.2 MG/ML IJ SOLN
INTRAMUSCULAR | Status: DC | PRN
  Administered 2019-03-13: 0.2 mg via INTRAVENOUS

## 2019-03-13 MED ORDER — REMIFENTANIL HCL 1 MG IV SOLR
INTRAVENOUS | Status: AC
  Filled 2019-03-13: qty 1000

## 2019-03-13 MED ORDER — SODIUM CHLORIDE 0.9 % IV SOLN
INTRAVENOUS | Status: DC | PRN
  Administered 2019-03-13: .1 ug/kg/min via INTRAVENOUS
  Administered 2019-03-13: 10:00:00 via INTRAVENOUS

## 2019-03-13 MED ORDER — SUCCINYLCHOLINE CHLORIDE 20 MG/ML IJ SOLN
INTRAMUSCULAR | Status: AC
  Filled 2019-03-13: qty 1

## 2019-03-13 MED ORDER — ALPRAZOLAM 0.5 MG PO TABS
0.5000 mg | ORAL_TABLET | Freq: Three times a day (TID) | ORAL | Status: DC
  Administered 2019-03-13 – 2019-03-14 (×3): 0.5 mg via ORAL
  Filled 2019-03-13 (×3): qty 1

## 2019-03-13 MED ORDER — HYDROCHLOROTHIAZIDE 12.5 MG PO CAPS
12.5000 mg | ORAL_CAPSULE | Freq: Every day | ORAL | Status: DC
  Filled 2019-03-13: qty 1

## 2019-03-13 MED ORDER — HYDROMORPHONE HCL 1 MG/ML IJ SOLN
0.5000 mg | INTRAMUSCULAR | Status: DC | PRN
  Administered 2019-03-13: 0.5 mg via INTRAVENOUS
  Filled 2019-03-13: qty 1

## 2019-03-13 MED ORDER — FENTANYL CITRATE (PF) 100 MCG/2ML IJ SOLN
INTRAMUSCULAR | Status: DC | PRN
  Administered 2019-03-13: 50 ug via INTRAVENOUS

## 2019-03-13 MED ORDER — THROMBIN 5000 UNITS EX SOLR
CUTANEOUS | Status: DC | PRN
  Administered 2019-03-13: 5000 [IU] via TOPICAL

## 2019-03-13 MED ORDER — LEVOTHYROXINE SODIUM 100 MCG PO TABS
100.0000 ug | ORAL_TABLET | Freq: Every day | ORAL | Status: DC
  Administered 2019-03-14: 07:00:00 100 ug via ORAL
  Filled 2019-03-13: qty 1

## 2019-03-13 MED ORDER — LIDOCAINE HCL (PF) 2 % IJ SOLN
INTRAMUSCULAR | Status: AC
  Filled 2019-03-13: qty 10

## 2019-03-13 MED ORDER — GLYCOPYRROLATE 0.2 MG/ML IJ SOLN
INTRAMUSCULAR | Status: AC
  Filled 2019-03-13: qty 1

## 2019-03-13 MED ORDER — SUCCINYLCHOLINE CHLORIDE 20 MG/ML IJ SOLN
INTRAMUSCULAR | Status: DC | PRN
  Administered 2019-03-13: 120 mg via INTRAVENOUS

## 2019-03-13 MED ORDER — EPHEDRINE SULFATE 50 MG/ML IJ SOLN
INTRAMUSCULAR | Status: AC
  Filled 2019-03-13: qty 1

## 2019-03-13 MED ORDER — MENTHOL 3 MG MT LOZG
1.0000 | LOZENGE | OROMUCOSAL | Status: DC | PRN
  Filled 2019-03-13: qty 9

## 2019-03-13 MED ORDER — AMLODIPINE BESYLATE 5 MG PO TABS
5.0000 mg | ORAL_TABLET | Freq: Every day | ORAL | Status: DC
  Administered 2019-03-14: 5 mg via ORAL
  Filled 2019-03-13: qty 1

## 2019-03-13 MED ORDER — OXYCODONE HCL 5 MG PO TABS
5.0000 mg | ORAL_TABLET | ORAL | Status: DC | PRN
  Administered 2019-03-13: 16:00:00 5 mg via ORAL
  Filled 2019-03-13: qty 1

## 2019-03-13 MED ORDER — SODIUM CHLORIDE 0.9% FLUSH: 3.0000 mL | INTRAVENOUS | Status: DC | PRN

## 2019-03-13 MED ORDER — PROPOFOL 10 MG/ML IV BOLUS
INTRAVENOUS | Status: DC | PRN
  Administered 2019-03-13: 180 mg via INTRAVENOUS

## 2019-03-13 MED ORDER — METHOCARBAMOL 500 MG PO TABS
500.0000 mg | ORAL_TABLET | Freq: Four times a day (QID) | ORAL | Status: DC
  Administered 2019-03-13 – 2019-03-14 (×3): 500 mg via ORAL
  Filled 2019-03-13 (×5): qty 1

## 2019-03-13 MED ORDER — PROPOFOL 10 MG/ML IV BOLUS
INTRAVENOUS | Status: AC
  Filled 2019-03-13: qty 40

## 2019-03-13 MED ORDER — SODIUM CHLORIDE 0.9% FLUSH: 3.0000 mL | Freq: Two times a day (BID) | INTRAVENOUS | Status: DC

## 2019-03-13 MED ORDER — ONDANSETRON HCL 4 MG/2ML IJ SOLN: 4.0000 mg | Freq: Four times a day (QID) | INTRAMUSCULAR | Status: DC | PRN

## 2019-03-13 MED ORDER — PHENYLEPHRINE HCL (PRESSORS) 10 MG/ML IV SOLN
INTRAVENOUS | Status: AC
  Filled 2019-03-13: qty 1

## 2019-03-13 MED ORDER — FAMOTIDINE 20 MG PO TABS
20.0000 mg | ORAL_TABLET | Freq: Once | ORAL | Status: AC
  Administered 2019-03-13: 20 mg via ORAL

## 2019-03-13 MED ORDER — FENTANYL CITRATE (PF) 100 MCG/2ML IJ SOLN
INTRAMUSCULAR | Status: AC
  Filled 2019-03-13: qty 2

## 2019-03-13 MED ORDER — FENTANYL CITRATE (PF) 100 MCG/2ML IJ SOLN
25.0000 ug | INTRAMUSCULAR | Status: DC | PRN
  Administered 2019-03-13 (×3): 25 ug via INTRAVENOUS

## 2019-03-13 MED ORDER — ONDANSETRON HCL 4 MG PO TABS: 4.0000 mg | ORAL_TABLET | Freq: Four times a day (QID) | ORAL | Status: DC | PRN

## 2019-03-13 MED ORDER — MAGNESIUM CITRATE PO SOLN
1.0000 | Freq: Once | ORAL | Status: DC | PRN
  Filled 2019-03-13: qty 296

## 2019-03-13 MED ORDER — SODIUM CHLORIDE 0.9 % IV SOLN
INTRAVENOUS | Status: DC
  Administered 2019-03-13: 13:00:00 via INTRAVENOUS

## 2019-03-13 MED ORDER — POLYETHYLENE GLYCOL 3350 17 G PO PACK: 17.0000 g | PACK | Freq: Every day | ORAL | Status: DC | PRN

## 2019-03-13 MED ORDER — SODIUM CHLORIDE 0.9 % IV SOLN
INTRAVENOUS | Status: DC | PRN
  Administered 2019-03-13: 09:00:00 20 ug/min via INTRAVENOUS

## 2019-03-13 MED ORDER — ACETAMINOPHEN 500 MG PO TABS
1000.0000 mg | ORAL_TABLET | Freq: Four times a day (QID) | ORAL | Status: AC
  Administered 2019-03-13 – 2019-03-14 (×4): 1000 mg via ORAL
  Filled 2019-03-13 (×5): qty 2

## 2019-03-13 MED ORDER — MIDAZOLAM HCL 2 MG/2ML IJ SOLN
INTRAMUSCULAR | Status: DC | PRN
  Administered 2019-03-13: 2 mg via INTRAVENOUS

## 2019-03-13 MED ORDER — BISACODYL 5 MG PO TBEC: 5.0000 mg | DELAYED_RELEASE_TABLET | Freq: Every day | ORAL | Status: DC | PRN

## 2019-03-13 MED ORDER — DEXAMETHASONE SODIUM PHOSPHATE 10 MG/ML IJ SOLN
INTRAMUSCULAR | Status: AC
  Filled 2019-03-13: qty 1

## 2019-03-13 MED ORDER — PHENYLEPHRINE HCL (PRESSORS) 10 MG/ML IV SOLN
INTRAVENOUS | Status: DC | PRN
  Administered 2019-03-13 (×3): 100 ug via INTRAVENOUS
  Administered 2019-03-13: 50 ug via INTRAVENOUS

## 2019-03-13 MED ORDER — LACTATED RINGERS IV SOLN
INTRAVENOUS | Status: DC
  Administered 2019-03-13: 08:00:00 via INTRAVENOUS

## 2019-03-13 MED ORDER — PROMETHAZINE HCL 25 MG/ML IJ SOLN: 6.2500 mg | INTRAMUSCULAR | Status: DC | PRN

## 2019-03-13 MED ORDER — LIDOCAINE-EPINEPHRINE 1 %-1:100000 IJ SOLN
INTRAMUSCULAR | Status: DC | PRN
  Administered 2019-03-13: 10 mL

## 2019-03-13 MED ORDER — DEXAMETHASONE SODIUM PHOSPHATE 10 MG/ML IJ SOLN
INTRAMUSCULAR | Status: DC | PRN
  Administered 2019-03-13: 10 mg via INTRAVENOUS

## 2019-03-13 MED ORDER — ACETAMINOPHEN 10 MG/ML IV SOLN
INTRAVENOUS | Status: AC
  Filled 2019-03-13: qty 100

## 2019-03-13 MED ORDER — ONDANSETRON HCL 4 MG/2ML IJ SOLN
INTRAMUSCULAR | Status: DC | PRN
  Administered 2019-03-13 (×2): 4 mg via INTRAVENOUS

## 2019-03-13 MED ORDER — DEXMEDETOMIDINE HCL IN NACL 200 MCG/50ML IV SOLN
INTRAVENOUS | Status: DC | PRN
  Administered 2019-03-13: 20 ug via INTRAVENOUS

## 2019-03-13 MED ORDER — DEXMEDETOMIDINE HCL IN NACL 80 MCG/20ML IV SOLN
INTRAVENOUS | Status: AC
  Filled 2019-03-13: qty 20

## 2019-03-13 MED ORDER — ONDANSETRON HCL 4 MG/2ML IJ SOLN
INTRAMUSCULAR | Status: AC
  Filled 2019-03-13: qty 4

## 2019-03-13 MED ORDER — PHENOL 1.4 % MT LIQD
1.0000 | OROMUCOSAL | Status: DC | PRN
  Filled 2019-03-13: qty 177

## 2019-03-13 MED ORDER — SODIUM CHLORIDE FLUSH 0.9 % IV SOLN
INTRAVENOUS | Status: AC
  Filled 2019-03-13: qty 10

## 2019-03-13 MED ORDER — METHOCARBAMOL 1000 MG/10ML IJ SOLN
500.0000 mg | Freq: Four times a day (QID) | INTRAVENOUS | Status: DC
  Filled 2019-03-13 (×10): qty 5

## 2019-03-13 MED ORDER — ACETAMINOPHEN 650 MG RE SUPP: 650.0000 mg | RECTAL | Status: DC | PRN

## 2019-03-13 MED ORDER — OXYCODONE HCL 5 MG PO TABS: 5.0000 mg | ORAL_TABLET | Freq: Once | ORAL | Status: DC | PRN

## 2019-03-13 MED ORDER — ARIPIPRAZOLE 15 MG PO TABS
15.0000 mg | ORAL_TABLET | Freq: Every day | ORAL | Status: DC
  Administered 2019-03-14: 10:00:00 15 mg via ORAL
  Filled 2019-03-13: qty 1

## 2019-03-13 MED ORDER — MEPERIDINE HCL 50 MG/ML IJ SOLN: 6.2500 mg | INTRAMUSCULAR | Status: DC | PRN

## 2019-03-13 MED ORDER — ACETAMINOPHEN 325 MG PO TABS: 650.0000 mg | ORAL_TABLET | ORAL | Status: DC | PRN

## 2019-03-13 MED ORDER — OXYCODONE HCL 5 MG PO TABS
10.0000 mg | ORAL_TABLET | ORAL | Status: DC | PRN
  Administered 2019-03-13 – 2019-03-14 (×2): 10 mg via ORAL
  Filled 2019-03-13 (×2): qty 2

## 2019-03-13 MED ORDER — LIDOCAINE HCL (CARDIAC) PF 100 MG/5ML IV SOSY
PREFILLED_SYRINGE | INTRAVENOUS | Status: DC | PRN
  Administered 2019-03-13: 100 mg via INTRAVENOUS

## 2019-03-13 MED ORDER — ALBUTEROL SULFATE (2.5 MG/3ML) 0.083% IN NEBU: 2.5000 mg | INHALATION_SOLUTION | Freq: Four times a day (QID) | RESPIRATORY_TRACT | Status: DC | PRN

## 2019-03-13 MED ORDER — TRAZODONE HCL 100 MG PO TABS
100.0000 mg | ORAL_TABLET | Freq: Every day | ORAL | Status: DC
  Administered 2019-03-13: 21:00:00 100 mg via ORAL
  Filled 2019-03-13: qty 1

## 2019-03-13 MED ORDER — FAMOTIDINE 20 MG PO TABS
ORAL_TABLET | ORAL | Status: AC
  Administered 2019-03-13: 08:00:00 20 mg via ORAL
  Filled 2019-03-13: qty 1

## 2019-03-13 MED ORDER — SENNA 8.6 MG PO TABS
1.0000 | ORAL_TABLET | Freq: Two times a day (BID) | ORAL | Status: DC
  Administered 2019-03-13 – 2019-03-14 (×2): 8.6 mg via ORAL
  Filled 2019-03-13 (×2): qty 1

## 2019-03-13 MED ORDER — DEXTROSE 5 % IV SOLN
3.0000 g | INTRAVENOUS | Status: AC
  Administered 2019-03-13: 09:00:00 3 g via INTRAVENOUS
  Filled 2019-03-13: qty 3

## 2019-03-13 MED ORDER — FENTANYL CITRATE (PF) 100 MCG/2ML IJ SOLN
INTRAMUSCULAR | Status: AC
  Administered 2019-03-13: 12:00:00 25 ug via INTRAVENOUS
  Filled 2019-03-13: qty 2

## 2019-03-13 MED ORDER — OXYCODONE HCL 5 MG/5ML PO SOLN: 5.0000 mg | Freq: Once | ORAL | Status: DC | PRN

## 2019-03-13 MED ORDER — PROPOFOL 500 MG/50ML IV EMUL
INTRAVENOUS | Status: DC | PRN
  Administered 2019-03-13: 20 ug/kg/min via INTRAVENOUS

## 2019-03-13 MED ORDER — SODIUM CHLORIDE 0.9 % IV SOLN
250.0000 mL | INTRAVENOUS | Status: DC
  Administered 2019-03-13: 15:00:00 250 mL via INTRAVENOUS

## 2019-03-13 SURGICAL SUPPLY — 67 items
BIT DRILL 13 (BIT) ×1 IMPLANT
BIT DRILL 13MM (BIT) ×1
BLADE BOVIE TIP EXT 4 (BLADE) ×3 IMPLANT
BLADE SURG 15 STRL LF DISP TIS (BLADE) ×1 IMPLANT
BLADE SURG 15 STRL SS (BLADE) ×2
BONE WEDGE CONERSTONE 7X14X11 (Bone Implant) ×2 IMPLANT
BUR DIAMOND COARSE 4.0 RND (BURR) ×3 IMPLANT
BUR NEURO DRILL SOFT 3.0X3.8M (BURR) ×3 IMPLANT
CANISTER SUCT 1200ML W/VALVE (MISCELLANEOUS) ×3 IMPLANT
CHLORAPREP W/TINT 26 (MISCELLANEOUS) ×6 IMPLANT
COLLAR CERV MED MED DENS 3 (SOFTGOODS) IMPLANT
COLLAR CERV SM MED DENS 3 (SOFTGOODS) IMPLANT
COLLAR CERV XL MED DENS 3 (SOFTGOODS) IMPLANT
COUNTER NEEDLE 20/40 LG (NEEDLE) ×3 IMPLANT
COVER LIGHT HANDLE STERIS (MISCELLANEOUS) ×6 IMPLANT
COVER WAND RF STERILE (DRAPES) ×3 IMPLANT
CRADLE LAMINECT ARM (MISCELLANEOUS) ×3 IMPLANT
CUP MEDICINE 2OZ PLAST GRAD ST (MISCELLANEOUS) ×6 IMPLANT
DERMABOND ADVANCED (GAUZE/BANDAGES/DRESSINGS) ×2
DERMABOND ADVANCED .7 DNX12 (GAUZE/BANDAGES/DRESSINGS) ×1 IMPLANT
DRAPE C-ARM 42X72 X-RAY (DRAPES) ×6 IMPLANT
DRAPE MICROSCOPE SPINE 48X150 (DRAPES) ×3 IMPLANT
DRAPE SURG 17X11 SM STRL (DRAPES) ×6 IMPLANT
DRAPE THYROID T SHEET (DRAPES) ×3 IMPLANT
ELECT CAUTERY BLADE TIP 2.5 (TIP) ×3
ELECT EZSTD 165MM 6.5IN (MISCELLANEOUS) ×3
ELECTRODE CAUTERY BLDE TIP 2.5 (TIP) ×1 IMPLANT
ELECTRODE EZSTD 165MM 6.5IN (MISCELLANEOUS) ×1 IMPLANT
FEE INTRAOP MONITOR IMPULS NCS (MISCELLANEOUS) IMPLANT
GAUZE SPONGE 4X4 12PLY STRL (GAUZE/BANDAGES/DRESSINGS) ×3 IMPLANT
GLOVE INDICATOR 7.0 STRL GRN (GLOVE) ×3 IMPLANT
GLOVE INDICATOR 8.0 STRL GRN (GLOVE) ×3 IMPLANT
GLOVE SURG SYN 7.0 (GLOVE) ×6 IMPLANT
GLOVE SURG SYN 7.0 PF PI (GLOVE) ×2 IMPLANT
GLOVE SURG SYN 8.0 (GLOVE) ×6 IMPLANT
GLOVE SURG SYN 8.0 PF PI (GLOVE) ×2 IMPLANT
GOWN STRL REUS W/ TWL XL LVL3 (GOWN DISPOSABLE) ×1 IMPLANT
GOWN STRL REUS W/TWL MED LVL3 (GOWN DISPOSABLE) ×3 IMPLANT
GOWN STRL REUS W/TWL XL LVL3 (GOWN DISPOSABLE) ×2
GRADUATE 1200CC STRL 31836 (MISCELLANEOUS) ×3 IMPLANT
INTRAOP MONITOR FEE IMPULS NCS (MISCELLANEOUS)
INTRAOP MONITOR FEE IMPULSE (MISCELLANEOUS)
IV CATH ANGIO 12GX3 LT BLUE (NEEDLE) ×3 IMPLANT
KIT TURNOVER KIT A (KITS) ×3 IMPLANT
MARKER SKIN DUAL TIP RULER LAB (MISCELLANEOUS) ×6 IMPLANT
NDL SPNL 22GX3.5 QUINCKE BK (NEEDLE) ×1 IMPLANT
NEEDLE HYPO 22GX1.5 SAFETY (NEEDLE) ×3 IMPLANT
NEEDLE SPNL 22GX3.5 QUINCKE BK (NEEDLE) ×3 IMPLANT
NS IRRIG 1000ML POUR BTL (IV SOLUTION) ×3 IMPLANT
PACK LAMINECTOMY NEURO (CUSTOM PROCEDURE TRAY) ×3 IMPLANT
PIN CASPAR 14 (PIN) ×1 IMPLANT
PIN CASPAR 14MM (PIN) ×3
PLATE ZEVO 1LVL 21MM (Plate) ×2 IMPLANT
PUTTY DBF 1CC CORTICAL FIBERS (Putty) ×2 IMPLANT
SCREW 13MM (Screw) ×8 IMPLANT
SPOGE SURGIFLO 8M (HEMOSTASIS) ×2
SPONGE KITTNER 5P (MISCELLANEOUS) ×3 IMPLANT
SPONGE SURGIFLO 8M (HEMOSTASIS) ×1 IMPLANT
SUT POLYSORB 2-0 5X18 GS-10 (SUTURE) ×6 IMPLANT
SUT VICRYL 2-0 SH 8X27 (SUTURE) ×3 IMPLANT
SUT VICRYL 3-0 CR8 SH (SUTURE) ×3 IMPLANT
SYR 30ML LL (SYRINGE) ×3 IMPLANT
TAPE CLOTH 3X10 WHT NS LF (GAUZE/BANDAGES/DRESSINGS) ×3 IMPLANT
TOWEL OR 17X26 4PK STRL BLUE (TOWEL DISPOSABLE) ×6 IMPLANT
TRAY FOLEY MTR SLVR 16FR STAT (SET/KITS/TRAYS/PACK) IMPLANT
TUBING CONNECTING 10 (TUBING) ×2 IMPLANT
TUBING CONNECTING 10' (TUBING) ×1

## 2019-03-13 NOTE — Anesthesia Procedure Notes (Signed)
Procedure Name: Intubation Performed by: Kelton Pillar, CRNA Pre-anesthesia Checklist: Patient identified Patient Re-evaluated:Patient Re-evaluated prior to induction Oxygen Delivery Method: Circle system utilized Preoxygenation: Pre-oxygenation with 100% oxygen Induction Type: IV induction Ventilation: Mask ventilation without difficulty Laryngoscope Size: McGraph and 3 Grade View: Grade I Tube type: Oral Tube size: 7.0 mm Number of attempts: 1 Airway Equipment and Method: Stylet Placement Confirmation: ETT inserted through vocal cords under direct vision,  CO2 detector,  positive ETCO2 and breath sounds checked- equal and bilateral Secured at: 21 cm Tube secured with: Tape Dental Injury: Teeth and Oropharynx as per pre-operative assessment

## 2019-03-13 NOTE — Transfer of Care (Signed)
Immediate Anesthesia Transfer of Care Note  Patient: Rachel Morse  Procedure(s) Performed: ANTERIOR CERVICAL DECOMPRESSION/DISCECTOMY FUSION 1 LEVEL C5-6 (N/A Neck)  Patient Location: PACU  Anesthesia Type:General  Level of Consciousness: awake, alert , oriented and patient cooperative  Airway & Oxygen Therapy: Patient Spontanous Breathing and Patient connected to face mask oxygen  Post-op Assessment: Report given to RN and Post -op Vital signs reviewed and stable  Post vital signs: Reviewed and stable  Last Vitals:  Vitals Value Taken Time  BP 134/77 03/13/19 1201  Temp    Pulse 92 03/13/19 1205  Resp 18 03/13/19 1205  SpO2 99 % 03/13/19 1205  Vitals shown include unvalidated device data.  Last Pain:  Vitals:   03/13/19 0736  PainSc: 6          Complications: No apparent anesthesia complications

## 2019-03-13 NOTE — Op Note (Signed)
Operative Note  SURGERY DATE:03/13/2019  PRE-OP DIAGNOSIS: Cervical Radiculopathy[M54.12] * Herniated nucleus pulposus, cervical [M50.20]  POST-OP DIAGNOSIS:Post-Op Diagnosis Codes: Cervical Radiculopathy[M54.12] * Herniated nucleus pulposus, cervical [M50.20]   Procedure(s): 1.ARTHRODESIS, ANTERIOR INTERBODY, INCLUDING DISC SPACE PREPARATION, DISCECTOMY, OSTEOPHYTECTOMY AND DECOMPRESSION OF SPINAL CORD AND/ORNERVE ROOTS; CERVICAL BELOW C2--C5/6ACDF 2.ANTERIOR INSTRUMENTATION; 2 TO 3 VERTEBRAL SEGMENTS (LIST IN ADDITION TO PRIMARY PROCEDURE) 3.ALLOGRAFT, STRUCTURAL, FOR SPINE SURGERY ONLY (LIST IN ADDITION TO PRIMARY PROCEDURE)  SURGEON: Surgeon(s) and Role: * Malen Gauze, MD - Primary  Marin Olp, [place], Asisstant  ANESTHESIA:General   OPERATIVE FINDINGS: Stenosis atC5/6  IIndications Ms. Thompsonpresented to our clinic on7/28withongoing painin the right and leftarm with hand numbness.Shehad failed conservative management of prescription medications, oral steroids, and exercise.Shehad a MRI that showed a large disc herniation causing stenosis centrally and on the rightat C5/6.Given this, we recommended an anterior cervical decompression and fusion to relieve the pressure on thenerve root and allow for healing.The risks of hematoma, infection, poor bone healing and failure of fusion, cord injury, weakness, numbness, neck pain, stroke, and death were discussed in detail. All questions were answered and the patient elected to proceed with the surgery.  Procedure After obtaining informed consent, the patient was taken to the Operating Room where general anesthesia was induced and the patient intubated. Vascular access was obtained. Decadronand antibioticswereadministered. The head was slightly extended and imaging used to identify a skin crease overlying the C5vertebral body. Appropriate padding was performed.   The patient  was prepped and draped in the usual sterile fashion and a timeout was performed per protocol. Local anesthesia was instilled with epinephrine along the planned incision site. A transverse cervicalincision was performed on the left in a skin crease. The incision was carried to the level of the platysma and then cautery was used to incise the muscle. Blunt dissection was used to expand the plane and the dissection was carried deep medial to the SCM and carotid sheath being careful to identify the trachea and esophagus medially. The prevertebral fascia was identified and ssen to be thickened and this was bluntly dissected to expose the disc space. A needle was placed in the disc space and x-ray confirmed the C5/6disc level.   Next, cautery was used to undermine the longus colli muscles bilaterally and identify the C5/6disc space. Caspar pins were placed at [facility] a retractor system placed under the muscles to complete the exposure. Next, a combination of curettes and Kerrison rongeurs were used to remove the anterior osteophyte at C5/6and then the disc material. A 59mm matchstick was used to shave the endplates of the adjacent bodies. A trial spacer was used to size the graft and then hemostasis obtained.  The microscope was brought into the field for the remainder of the surgery. The matchsitickdrill bitwas used to remove the osteophyte/disc complex deep to the level of the PLL at C5/6. There was disc protrusion at this level that was removed with rongeurs. The PLL was entered with a hook and then the PLL was removed along with remaining disc material to decompress centrally and then out into bilateral neuro foramen. There was a large disc fragment seen on the right.  A blunt probe was used to confirm no residual stenosis laterally and a curette used to ensure no posterior osteophyte remained. Floseal was used for hemostasis. Allograft was placed,15mm in height and placed slightly recessed to  theanterior edge of vertebral bodyto promote arthrodesis.  The caspar pins were removed and bone wax placed. The remainder of  the osteophytes were removedto allow for plating. Next, a22mm - one levelplate was found to be the adequate size and was placed in the midline and secured with two 72mm screws at each bone level. Xray was obtained confirming good graft placement and adequate depth of screws. The retractors were removed. The wound was irrigated copiously and hemostasis obtained. The platysma was closed with 2-0 vicryl suture. The dermis was closed with 3-0 Vicryl and Dermabond was placed on the skin.  The patient had general anesthesia reversed and was extubated following the procedure.She awoke following commands with symmetric movement.She was taken to the PACU whereshe continued recovery and then the ward.   ESTIMATED BLOOD LOSS: 20cc  SPECIMENS None  IMPLANT PUTTY DBF 1CC CORTICAL FIBERS - [id number]  Inventory Item: PUTTY DBF 1CC CORTICAL FIBERS Serial no.: [id number] Model/Cat no.: [id number]  Implant name: PUTTY DBF 1CC CORTICAL FIBERS - [id number] Laterality: N/A Area: Neck  Manufacturer: [employer] Date of Manufacture:    Action: Implanted Number Used: 1   Device Identifier:  Device Identifier Type:    PLATE ZEVO 1LVL 579FGE - [id number]  Inventory Item: PLATE ZEVO 1LVL 579FGE Serial no.:  Model/Cat no.: [id number]  Implant name: PLATE ZEVO 1LVL 579FGE - [id number] Laterality: N/A Area: Neck  Manufacturer: [employer] Date of Manufacture:    Action: Implanted Number Used: 1   Device Identifier:  Device Identifier Type:    SCREW 13MM - [id number]  Inventory Item: SCREW 13MM Serial no.:  Model/Cat no.: [id number]  Implant name: SCREW 13MM - [id number] Laterality: N/A Area: Neck  Manufacturer: [employer] Date of Manufacture:    Action: Implanted        I performed the case in its entiretywith assistance of PA, Corrie Mckusick,  [place]
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
## 2019-03-13 NOTE — Anesthesia Post-op Follow-up Note (Signed)
Anesthesia QCDR form completed.        

## 2019-03-13 NOTE — Anesthesia Preprocedure Evaluation (Signed)
Anesthesia Evaluation  Patient identified by MRN, date of birth, ID band Patient awake    Reviewed: Allergy & Precautions, NPO status , Patient's Chart, lab work & pertinent test results  History of Anesthesia Complications (+) PONV and history of anesthetic complications  Airway Mallampati: IV  TM Distance: >3 FB Neck ROM: Full    Dental no notable dental hx.    Pulmonary neg sleep apnea, COPD,  COPD inhaler, Patient abstained from smoking., former smoker,    breath sounds clear to auscultation- rhonchi (-) wheezing      Cardiovascular hypertension, Pt. on medications (-) CAD, (-) Past MI, (-) Cardiac Stents and (-) CABG  Rhythm:Regular Rate:Normal - Systolic murmurs and - Diastolic murmurs    Neuro/Psych neg Seizures PSYCHIATRIC DISORDERS Anxiety negative neurological ROS     GI/Hepatic negative GI ROS, Neg liver ROS,   Endo/Other  negative endocrine ROSneg diabetes  Renal/GU negative Renal ROS     Musculoskeletal  (+) Arthritis ,   Abdominal (+) + obese,   Peds  Hematology negative hematology ROS (+)   Anesthesia Other Findings Past Medical History: No date: Anxiety No date: Arthritis No date: Complication of anesthesia No date: COPD (chronic obstructive pulmonary disease) ([facility]) No date: Dysrhythmia     Comment:  slow heart rate No date: Hypertension No date: Panic attacks No date: PONV (postoperative nausea and vomiting)   Reproductive/Obstetrics                             Anesthesia Physical Anesthesia Plan  ASA: II  Anesthesia Plan: General   Post-op Pain Management:    Induction: Intravenous  PONV Risk Score and Plan: 3 and Ondansetron, Dexamethasone, Propofol infusion and Midazolam  Airway Management Planned: Oral ETT  Additional Equipment:   Intra-op Plan:   Post-operative Plan: Extubation in OR  Informed Consent: I have reviewed the patients History and  Physical, chart, labs and discussed the procedure including the risks, benefits and alternatives for the proposed anesthesia with the patient or authorized representative who has indicated his/her understanding and acceptance.     Dental advisory given  Plan Discussed with: CRNA and Anesthesiologist  Anesthesia Plan Comments:         Anesthesia Quick Evaluation

## 2019-03-13 NOTE — Anesthesia Postprocedure Evaluation (Signed)
Anesthesia Post Note  Patient: Rachel Morse  Procedure(s) Performed: ANTERIOR CERVICAL DECOMPRESSION/DISCECTOMY FUSION 1 LEVEL C5-6 (N/A Neck)  Patient location during evaluation: PACU Anesthesia Type: General Level of consciousness: awake and alert and oriented Pain management: pain level controlled Vital Signs Assessment: post-procedure vital signs reviewed and stable Respiratory status: spontaneous breathing, nonlabored ventilation and respiratory function stable Cardiovascular status: blood pressure returned to baseline and stable Postop Assessment: no signs of nausea or vomiting Anesthetic complications: no     Last Vitals:  Vitals:   03/13/19 1301 03/13/19 1316  BP: 121/74 128/66  Pulse: 86 83  Resp: 20 20  Temp:  37.1 C  SpO2: 95% 95%    Last Pain:  Vitals:   03/13/19 1316  PainSc: 4                  Sherill Manroop Jakubowicz

## 2019-03-13 NOTE — Progress Notes (Signed)
Pharmacy consult for Cefazolin-surgical prophylaxis:  53 yo F weight= 127 kg  Will order Cefazolin 3 gm IV x1 pre-op   Chinita Greenland PharmD Clinical Pharmacist 03/13/2019

## 2019-03-14 ENCOUNTER — Encounter: Payer: Self-pay | Admitting: Neurosurgery

## 2019-03-14 ENCOUNTER — Observation Stay: Payer: Medicare Other

## 2019-03-14 DIAGNOSIS — M50122 Cervical disc disorder at C5-C6 level with radiculopathy: Secondary | ICD-10-CM | POA: Diagnosis not present

## 2019-03-14 MED ORDER — METHOCARBAMOL 500 MG PO TABS: 500.0000 mg | ORAL_TABLET | Freq: Four times a day (QID) | ORAL | 0 refills | Status: DC | PRN

## 2019-03-14 MED ORDER — OXYCODONE HCL 5 MG PO TABS: 5.0000 mg | ORAL_TABLET | ORAL | 0 refills | Status: DC | PRN

## 2019-03-14 MED ORDER — METHYLPREDNISOLONE 4 MG PO TBPK: ORAL_TABLET | ORAL | 0 refills | Status: DC

## 2019-03-14 NOTE — TOC Transition Note (Signed)
Transition of Care [facility]) - CM/SW Discharge Note   Patient Details  Name: Barbi Kumagai MRN: [id number] Date of Birth: 14-Oct-1965  Transition of Care [facility]) CM/SW Contact:  Zamariyah Furukawa, Lenice Llamas Phone Number: [phone]  03/14/2019, 3:13 PM   Clinical Narrative: Clinical Social Worker (CSW) met with patient to discuss D/C plan. Patient was alert and oriented X4 and was walking around in the room independently. CSW introduced self and explained role of CSW department. Per patient she lives in [place] with her 74 y.o son and 59 y.o son. Per patient she has has a 84 y.o daughter that lives near by. Patient reported that she is independent and has no needs. Patient asked about medicaid. CSW explained that patient can apply for medicaid at the department of social services. Patient verbalized her understanding and reported no other needs or concerns. Please reconsult if future social work needs arise. CSW signing off.      Final next level of care: Home/Self Care Barriers to Discharge: Barriers Resolved   Patient Goals and CMS Choice Patient states their goals for this hospitalization and ongoing recovery are:: To go home.      Discharge Placement                       Discharge Plan and Services                DME Arranged: N/A         HH Arranged: NA          Social Determinants of Health (SDOH) Interventions     Readmission Risk Interventions No flowsheet data found.

## 2019-03-14 NOTE — Discharge Instructions (Signed)

## 2019-03-14 NOTE — Progress Notes (Signed)
Pt ambulated to bathroom during the night. Pain controlled. No acute distress noted during this shift. Will continue to monitor pt

## 2019-03-14 NOTE — Progress Notes (Signed)
Discharge summary reviewed with verbal understanding. Rx given. Soft color placed. Escorted to personal vehicle

## 2019-03-14 NOTE — Discharge Summary (Signed)
Procedure: C5-6 ACFD Procedure date: 03/13/2019 Diagnosis: Cervical radiculopathy  History: Rachel Morse is s/p C5-6 ACDF for cervical radiculopathy POD1: Recovering well. Complains fo discomfort when swallowing but able to tolerate soft foods. Pain 4-5/10. She has ambulated and voided without issue. Numbness in fingertips has resolved following procedure. No new pain, numbness/tingling/weakness.   Physical Exam: Vitals:   03/14/19 0525 03/14/19 0812  BP: 120/71 (!) 145/92  Pulse: 78 82  Resp: 18 18  Temp: 97.9 F (36.6 C) 98.1 F (36.7 C)  SpO2: 94% 99%    AA Ox3 Strength:5/5 throughout upper extremities Sensation: intact aknd symmetric throughout upper extremities Skin: glue intact at incision site. No significant swelling notes. No bleeding or drainage.   Data:  Recent Labs  Lab 03/08/19 1355  NA 135  K 3.9  CL 102  CO2 22  BUN 25*  CREATININE 0.99  GLUCOSE 105*  CALCIUM 9.1   No results for input(s): AST, ALT, ALKPHOS in the last 168 hours.  Invalid input(s): TBILI   Recent Labs  Lab 03/08/19 1355  WBC 9.0  HGB 14.6  HCT 43.4  PLT 287   Recent Labs  Lab 03/08/19 1355  APTT 34  INR 1.1         Other tests/results: EXAM: CERVICAL SPINE - 2-3 VIEW 03/14/2019  COMPARISON:  03/13/2019.  MRI 01/30/2019.  FINDINGS: Limited visualization of C7 on lateral view. C5-C6 anterior and interbody fusion. Hardware intact. Anatomic alignment. Postsurgical changes C7. Hardware intact. No acute bony abnormality. Mild biapical pleural thickening noted most likely scarring. Carotid vascular calcification cannot be excluded.  IMPRESSION: 1. Postsurgical changes cervical spine. Hardware intact. Anatomic alignment.  2.  Carotid vascular disease cannot be excluded.  Assessment/Plan:  Rachel Morse is s/p C5-6 ACDF. She is recovering well. Symptoms that were present prior to surgery have resolved. Will provide steroid taper to help improving  swallowing complaints. Will provide pain med and robaxin for post op pain complaints. Discussed wound care and activity restrictions. She is scheduled to follow up in clinic in 2 weeks to monitor progress. Advised to contact office if any questions or concerns arise before then.   Marin Olp PA-C Department of Neurosurgery

## 2019-03-14 NOTE — Care Management Obs Status (Signed)
[facility] NOTIFICATION   Patient Details  Name: Deriana Gohlke MRN: [id number] Date of Birth: 1965-12-01   Medicare Observation Status Notification Given:  Yes    Sharolyn Weber, Veronia Beets, LCSW 03/14/2019, 10:41 AM

## 2019-05-04 ENCOUNTER — Encounter: Payer: Self-pay | Admitting: Neurosurgery

## 2019-05-11 ENCOUNTER — Other Ambulatory Visit: Payer: Self-pay | Admitting: Family Medicine

## 2019-05-11 DIAGNOSIS — Z1231 Encounter for screening mammogram for malignant neoplasm of breast: Secondary | ICD-10-CM

## 2019-05-18 ENCOUNTER — Other Ambulatory Visit: Payer: Self-pay

## 2019-05-18 ENCOUNTER — Telehealth: Payer: Self-pay

## 2019-05-18 DIAGNOSIS — Z1211 Encounter for screening for malignant neoplasm of colon: Secondary | ICD-10-CM

## 2019-05-18 NOTE — Telephone Encounter (Signed)
Gastroenterology Pre-Procedure Review  Request Date: Monday 06/05/19 Requesting Physician: Dr. Marius Ditch  PATIENT REVIEW QUESTIONS: The patient responded to the following health history questions as indicated:    1. Are you having any GI issues? no 2. Do you have a personal history of Polyps? no 3. Do you have a family history of Colon Cancer or Polyps? yes (parents colon polyps) 4. Diabetes Mellitus? no 5. Joint replacements in the past 12 months?Spine surgery 02/2019 6. Major health problems in the past 3 months?no 7. Any artificial heart valves, MVP, or defibrillator?abnormal heart beat    MEDICATIONS & ALLERGIES:    Patient reports the following regarding taking any anticoagulation/antiplatelet therapy:   Plavix, Coumadin, Eliquis, Xarelto, Lovenox, Pradaxa, Brilinta, or Effient? no Aspirin? no  Patient confirms/reports the following medications:  Current Outpatient Medications  Medication Sig Dispense Refill  . albuterol (PROVENTIL HFA;VENTOLIN HFA) 108 (90 Base) MCG/ACT inhaler Inhale 2-4 puffs by mouth every 4 hours as needed for wheezing, cough, and/or shortness of breath 1 Inhaler 1  . ALPRAZolam (XANAX) 0.5 MG tablet Take 0.5 mg by mouth 3 (three) times daily.    Marland Kitchen amLODipine (NORVASC) 5 MG tablet Take 5 mg by mouth daily.    . ARIPiprazole (ABILIFY) 15 MG tablet Take 15 mg by mouth daily.    . hydrochlorothiazide (MICROZIDE) 12.5 MG capsule Take 12.5 mg by mouth daily.    Marland Kitchen levothyroxine (SYNTHROID) 100 MCG tablet Take 100 mcg by mouth daily before breakfast.    . methocarbamol (ROBAXIN) 500 MG tablet Take 1 tablet (500 mg total) by mouth every 6 (six) hours as needed for muscle spasms. 90 tablet 0  . methylPREDNISolone (MEDROL DOSEPAK) 4 MG TBPK tablet Follow package directions 21 tablet 0  . oxyCODONE (OXY IR/ROXICODONE) 5 MG immediate release tablet Take 1 tablet (5 mg total) by mouth every 4 (four) hours as needed for moderate pain ((score 4 to 6)). 30 tablet 0  .  oxyCODONE-acetaminophen (PERCOCET) 10-325 MG tablet Take 1 tablet by mouth every 8 (eight) hours as needed for pain.    . promethazine (PHENERGAN) 25 MG tablet Take 25 mg by mouth every 8 (eight) hours as needed for nausea or vomiting.    . traZODone (DESYREL) 100 MG tablet Take 100 mg by mouth at bedtime.     No current facility-administered medications for this visit.     Patient confirms/reports the following allergies:  Allergies  Allergen Reactions  . Ivp Dye [Iodinated Diagnostic Agents]   . Tape Dermatitis    No orders of the defined types were placed in this encounter.   AUTHORIZATION INFORMATION Primary Insurance: 1D#: Group #:  Secondary Insurance: 1D#: Group #:  SCHEDULE INFORMATION: Date: Monday 06/05/19 Time: Location:[facility]

## 2019-06-01 ENCOUNTER — Other Ambulatory Visit
Admission: RE | Admit: 2019-06-01 | Discharge: 2019-06-01 | Disposition: A | Discharge status: Home / Self Care | Payer: Medicare Other | Source: Ambulatory Visit | Attending: Gastroenterology | Admitting: Gastroenterology

## 2019-06-01 ENCOUNTER — Other Ambulatory Visit: Payer: Self-pay

## 2019-06-01 DIAGNOSIS — Z01812 Encounter for preprocedural laboratory examination: Secondary | ICD-10-CM | POA: Diagnosis present

## 2019-06-01 DIAGNOSIS — Z20828 Contact with and (suspected) exposure to other viral communicable diseases: Secondary | ICD-10-CM | POA: Diagnosis not present

## 2019-06-01 LAB — SARS CORONAVIRUS 2 (TAT 6-24 HRS): SARS Coronavirus 2: NEGATIVE

## 2019-06-02 ENCOUNTER — Encounter: Payer: Self-pay | Admitting: *Deleted

## 2019-06-05 ENCOUNTER — Encounter: Payer: Self-pay | Admitting: *Deleted

## 2019-06-05 ENCOUNTER — Ambulatory Visit: Payer: Medicare Other | Admitting: Certified Registered Nurse Anesthetist

## 2019-06-05 ENCOUNTER — Encounter
Admission: RE | Disposition: A | Discharge status: Home / Self Care | Payer: Self-pay | Source: Home / Self Care | Attending: Gastroenterology

## 2019-06-05 ENCOUNTER — Other Ambulatory Visit: Payer: Self-pay

## 2019-06-05 ENCOUNTER — Ambulatory Visit
Admission: RE | Admit: 2019-06-05 | Discharge: 2019-06-05 | Disposition: A | Discharge status: Home / Self Care | Payer: Medicare Other | Attending: Gastroenterology | Admitting: Gastroenterology

## 2019-06-05 DIAGNOSIS — F419 Anxiety disorder, unspecified: Secondary | ICD-10-CM | POA: Diagnosis not present

## 2019-06-05 DIAGNOSIS — D123 Benign neoplasm of transverse colon: Secondary | ICD-10-CM | POA: Diagnosis not present

## 2019-06-05 DIAGNOSIS — K635 Polyp of colon: Secondary | ICD-10-CM | POA: Diagnosis not present

## 2019-06-05 DIAGNOSIS — Z79899 Other long term (current) drug therapy: Secondary | ICD-10-CM | POA: Insufficient documentation

## 2019-06-05 DIAGNOSIS — I10 Essential (primary) hypertension: Secondary | ICD-10-CM | POA: Diagnosis not present

## 2019-06-05 DIAGNOSIS — D124 Benign neoplasm of descending colon: Secondary | ICD-10-CM | POA: Insufficient documentation

## 2019-06-05 DIAGNOSIS — Z79890 Hormone replacement therapy (postmenopausal): Secondary | ICD-10-CM | POA: Diagnosis not present

## 2019-06-05 DIAGNOSIS — K621 Rectal polyp: Secondary | ICD-10-CM

## 2019-06-05 DIAGNOSIS — J449 Chronic obstructive pulmonary disease, unspecified: Secondary | ICD-10-CM | POA: Insufficient documentation

## 2019-06-05 DIAGNOSIS — Z1211 Encounter for screening for malignant neoplasm of colon: Secondary | ICD-10-CM | POA: Insufficient documentation

## 2019-06-05 DIAGNOSIS — M1990 Unspecified osteoarthritis, unspecified site: Secondary | ICD-10-CM | POA: Insufficient documentation

## 2019-06-05 DIAGNOSIS — Z87891 Personal history of nicotine dependence: Secondary | ICD-10-CM | POA: Diagnosis not present

## 2019-06-05 DIAGNOSIS — Z6841 Body Mass Index (BMI) 40.0 and over, adult: Secondary | ICD-10-CM | POA: Diagnosis not present

## 2019-06-05 HISTORY — PX: COLONOSCOPY WITH PROPOFOL: SHX5780

## 2019-06-05 SURGERY — COLONOSCOPY WITH PROPOFOL
Anesthesia: General

## 2019-06-05 MED ORDER — PROPOFOL 500 MG/50ML IV EMUL
INTRAVENOUS | Status: AC
  Filled 2019-06-05: qty 50

## 2019-06-05 MED ORDER — EPHEDRINE SULFATE 50 MG/ML IJ SOLN
INTRAMUSCULAR | Status: DC | PRN
  Administered 2019-06-05: 5 mg via INTRAVENOUS
  Administered 2019-06-05: 10 mg via INTRAVENOUS

## 2019-06-05 MED ORDER — SODIUM CHLORIDE 0.9 % IV SOLN
INTRAVENOUS | Status: DC
  Administered 2019-06-05: 09:00:00 via INTRAVENOUS

## 2019-06-05 MED ORDER — PROPOFOL 500 MG/50ML IV EMUL
INTRAVENOUS | Status: DC | PRN
  Administered 2019-06-05: 175 ug/kg/min via INTRAVENOUS

## 2019-06-05 MED ORDER — PROPOFOL 10 MG/ML IV BOLUS
INTRAVENOUS | Status: DC | PRN
  Administered 2019-06-05: 70 mg via INTRAVENOUS

## 2019-06-05 MED ORDER — LIDOCAINE HCL (CARDIAC) PF 100 MG/5ML IV SOSY
PREFILLED_SYRINGE | INTRAVENOUS | Status: DC | PRN
  Administered 2019-06-05: 50 mg via INTRAVENOUS

## 2019-06-05 NOTE — Anesthesia Postprocedure Evaluation (Signed)
Anesthesia Post Note  Patient: Rachel Morse  Procedure(s) Performed: COLONOSCOPY WITH PROPOFOL (N/A )  Patient location during evaluation: Endoscopy Anesthesia Type: General Level of consciousness: awake and alert Pain management: pain level controlled Vital Signs Assessment: post-procedure vital signs reviewed and stable Respiratory status: spontaneous breathing, nonlabored ventilation, respiratory function stable and patient connected to nasal cannula oxygen Cardiovascular status: blood pressure returned to baseline and stable Postop Assessment: no apparent nausea or vomiting Anesthetic complications: no     Last Vitals:  Vitals:   06/05/19 1005 06/05/19 1014  BP: 120/64 (!) 123/56  Pulse: 72   Resp: 20   Temp: (!) 36.2 C   SpO2: 98%     Last Pain:  Vitals:   06/05/19 1034  TempSrc:   PainSc: 0-No pain                 Martha Clan

## 2019-06-05 NOTE — H&P (Signed)
Cephas Darby, MD [address]  [place]  [place], [place] [address]  Main: [phone]  Fax: [phone] Pager: [phone]  Primary Care Physician:  [facility] Primary Gastroenterologist:  Dr. Cephas Darby  Pre-Procedure History & Physical: HPI:  Rachel Morse is a 53 y.o. female is here for an colonoscopy.   Past Medical History:  Diagnosis Date  . Anxiety   . Arthritis   . Complication of anesthesia   . COPD (chronic obstructive pulmonary disease) ([place])   . Dysrhythmia    slow heart rate  . Hypertension   . Panic attacks   . PONV (postoperative nausea and vomiting)     Past Surgical History:  Procedure Laterality Date  . ABDOMINAL HYSTERECTOMY    . ANTERIOR CERVICAL DECOMP/DISCECTOMY FUSION N/A 03/13/2019   Procedure: ANTERIOR CERVICAL DECOMPRESSION/DISCECTOMY FUSION 1 LEVEL C5-6;  Surgeon: Deetta Perla, MD;  Location: [facility] ORS;  Service: Neurosurgery;  Laterality: N/A;  . BACK SURGERY     x 3  . NECK SURGERY    . TONSILLECTOMY      Prior to Admission medications   Medication Sig Start Date End Date Taking? Authorizing Provider  ALPRAZolam Duanne Moron) 0.5 MG tablet Take 0.5 mg by mouth 3 (three) times daily.   Yes [provider]  amLODipine (NORVASC) 5 MG tablet Take 5 mg by mouth daily.   Yes [provider]  ARIPiprazole (ABILIFY) 15 MG tablet Take 15 mg by mouth daily.   Yes [provider]  hydrochlorothiazide (MICROZIDE) 12.5 MG capsule Take 12.5 mg by mouth daily.   Yes [provider]  levothyroxine (SYNTHROID) 100 MCG tablet Take 100 mcg by mouth daily before breakfast.   Yes [provider]  oxyCODONE-acetaminophen (PERCOCET) 10-325 MG tablet Take 1 tablet by mouth every 8 (eight) hours as needed for pain.   Yes [provider]  promethazine (PHENERGAN) 25 MG tablet Take 25 mg by mouth every 8 (eight) hours as needed for nausea or vomiting.   Yes [provider]   traZODone (DESYREL) 100 MG tablet Take 100 mg by mouth at bedtime.   Yes [provider]  albuterol (PROVENTIL HFA;VENTOLIN HFA) 108 (90 Base) MCG/ACT inhaler Inhale 2-4 puffs by mouth every 4 hours as needed for wheezing, cough, and/or shortness of breath 12/28/17   Hinda Kehr, MD  methocarbamol (ROBAXIN) 500 MG tablet Take 1 tablet (500 mg total) by mouth every 6 (six) hours as needed for muscle spasms. 03/14/19   Marin Olp, PA-C  methylPREDNISolone (MEDROL DOSEPAK) 4 MG TBPK tablet Follow package directions Patient not taking: Reported on 06/05/2019 03/14/19   Marin Olp, PA-C  oxyCODONE (OXY IR/ROXICODONE) 5 MG immediate release tablet Take 1 tablet (5 mg total) by mouth every 4 (four) hours as needed for moderate pain ((score 4 to 6)). Patient not taking: Reported on 06/05/2019 03/14/19   Marin Olp, PA-C    Allergies as of 05/18/2019 - Review Complete 03/13/2019  Allergen Reaction Noted  . Ivp dye [iodinated diagnostic agents]  01/31/2019  . Tape Dermatitis 07/20/2015    Family History  Problem Relation Age of Onset  . COPD Mother   . Stroke Mother   . Asthma Father   . Heart disease Father     Social History   Socioeconomic History  . Marital status: Divorced    Spouse name: Not on file  . Number of children: Not on file  . Years of education: Not on file  . Highest  education level: Not on file  Occupational History  . Not on file  Social Needs  . Financial resource strain: Not on file  . Food insecurity    Worry: Not on file    Inability: Not on file  . Transportation needs    Medical: Not on file    Non-medical: Not on file  Tobacco Use  . Smoking status: Former Smoker    Packs/day: 0.50    Types: Cigarettes    Quit date: 02/05/2019    Years since quitting: 0.3  . Smokeless tobacco: Never Used  Substance and Sexual Activity  . Alcohol use: No  . Drug use: No  . Sexual activity: Not on file  Lifestyle  . Physical activity    Days per  week: Not on file    Minutes per session: Not on file  . Stress: Not on file  Relationships  . Social Herbalist on phone: Not on file    Gets together: Not on file    Attends religious service: Not on file    Active member of club or organization: Not on file    Attends meetings of clubs or organizations: Not on file    Relationship status: Not on file  . Intimate partner violence    Fear of current or ex partner: Not on file    Emotionally abused: Not on file    Physically abused: Not on file    Forced sexual activity: Not on file  Other Topics Concern  . Not on file  Social History Narrative  . Not on file    Review of Systems: See HPI, otherwise negative ROS  Physical Exam: BP 123/66   Pulse 68   Temp (!) 96.8 F (36 C) (Temporal)   Resp 18   Ht 5\' 7"  (1.702 m)   Wt 126.6 kg   SpO2 98%   BMI 43.70 kg/m  General:   Alert,  pleasant and cooperative in NAD Head:  Normocephalic and atraumatic. Neck:  Supple; no masses or thyromegaly. Lungs:  Clear throughout to auscultation.    Heart:  Regular rate and rhythm. Abdomen:  Soft, nontender and nondistended. Normal bowel sounds, without guarding, and without rebound.   Neurologic:  Alert and  oriented x4;  grossly normal neurologically.  Impression/Plan: Rachel Morse is here for an colonoscopy to be performed for colon cancer screening  Risks, benefits, limitations, and alternatives regarding  colonoscopy have been reviewed with the patient.  Questions have been answered.  All parties agreeable.   Sherri Sear, MD  06/05/2019, 9:14 AM

## 2019-06-05 NOTE — Transfer of Care (Signed)
Immediate Anesthesia Transfer of Care Note  Patient: Rachel Morse  Procedure(s) Performed: COLONOSCOPY WITH PROPOFOL (N/A )  Patient Location: PACU  Anesthesia Type:General  Level of Consciousness: awake and drowsy  Airway & Oxygen Therapy: Patient Spontanous Breathing  Post-op Assessment: Report given to RN and Post -op Vital signs reviewed and stable  Post vital signs: Reviewed and stable  Last Vitals:  Vitals Value Taken Time  BP 120/64 06/05/19 1005  Temp 36.2 C 06/05/19 1005  Pulse 75 06/05/19 1006  Resp 14 06/05/19 1006  SpO2 99 % 06/05/19 1006  Vitals shown include unvalidated device data.  Last Pain:  Vitals:   06/05/19 0810  TempSrc: Temporal         Complications: No apparent anesthesia complications

## 2019-06-05 NOTE — Anesthesia Preprocedure Evaluation (Signed)
Anesthesia Evaluation  Patient identified by MRN, date of birth, ID band Patient awake    Reviewed: Allergy & Precautions, NPO status , Patient's Chart, lab work & pertinent test results  History of Anesthesia Complications (+) PONV and history of anesthetic complications  Airway Mallampati: IV  TM Distance: >3 FB Neck ROM: Full    Dental no notable dental hx.    Pulmonary neg shortness of breath, neg sleep apnea, COPD,  COPD inhaler, neg recent URI, Patient abstained from smoking., former smoker,    breath sounds clear to auscultation- rhonchi (-) wheezing      Cardiovascular hypertension, Pt. on medications (-) CAD, (-) Past MI, (-) Cardiac Stents and (-) CABG  Rhythm:Regular Rate:Normal - Systolic murmurs and - Diastolic murmurs    Neuro/Psych neg Seizures PSYCHIATRIC DISORDERS Anxiety negative neurological ROS     GI/Hepatic negative GI ROS, Neg liver ROS,   Endo/Other  neg diabetesMorbid obesity  Renal/GU negative Renal ROS     Musculoskeletal  (+) Arthritis ,   Abdominal (+) + obese,   Peds  Hematology negative hematology ROS (+)   Anesthesia Other Findings Past Medical History: No date: Anxiety No date: Arthritis No date: Complication of anesthesia No date: COPD (chronic obstructive pulmonary disease) (HCC) No date: Dysrhythmia     Comment:  slow heart rate No date: Hypertension No date: Panic attacks No date: PONV (postoperative nausea and vomiting)   Reproductive/Obstetrics                             Anesthesia Physical  Anesthesia Plan  ASA: III  Anesthesia Plan: General   Post-op Pain Management:    Induction: Intravenous  PONV Risk Score and Plan: 3 and Propofol infusion and TIVA  Airway Management Planned: Natural Airway and Nasal Cannula  Additional Equipment:   Intra-op Plan:   Post-operative Plan:   Informed Consent: I have reviewed the patients  History and Physical, chart, labs and discussed the procedure including the risks, benefits and alternatives for the proposed anesthesia with the patient or authorized representative who has indicated his/her understanding and acceptance.     Dental advisory given  Plan Discussed with: CRNA and Anesthesiologist  Anesthesia Plan Comments:         Anesthesia Quick Evaluation

## 2019-06-05 NOTE — Op Note (Signed)
[facility] Gastroenterology Patient Name: Rachel Morse Procedure Date: 06/05/2019 9:09 AM MRN: [id number] Account #: [id number] Date of Birth: [birth year] Admit Type: Outpatient Age: 53 Room: [facility] ENDO ROOM 2 Gender: Female Note Status: Finalized Procedure:             Colonoscopy Indications:           Screening for colorectal malignant neoplasm, This is                         the patient's first colonoscopy Providers:             Lin Landsman MD, MD Referring MD:          [place], MD (Referring MD) Medicines:             Monitored Anesthesia Care Complications:         No immediate complications. Estimated blood loss: None. Procedure:             Pre-Anesthesia Assessment:                        - Prior to the procedure, a History and Physical was                         performed, and patient medications and allergies were                         reviewed. The patient is competent. The risks and                         benefits of the procedure and the sedation options and                         risks were discussed with the patient. All questions                         were answered and informed consent was obtained.                         Patient identification and proposed procedure were                         verified by the physician, the nurse, the                         anesthesiologist, the anesthetist and the technician                         in the pre-procedure area in the procedure room in the                         endoscopy suite. Mental Status Examination: alert and                         oriented. Airway Examination: normal oropharyngeal                         airway and neck mobility. Respiratory Examination:  clear to auscultation. CV Examination: normal.                         Prophylactic Antibiotics: The patient does not require                         prophylactic  antibiotics. Prior Anticoagulants: The                         patient has taken no previous anticoagulant or                         antiplatelet agents. ASA Grade Assessment: III - A                         patient with severe systemic disease. After reviewing                         the risks and benefits, the patient was deemed in                         satisfactory condition to undergo the procedure. The                         anesthesia plan was to use monitored anesthesia care                         (MAC). Immediately prior to administration of                         medications, the patient was re-assessed for adequacy                         to receive sedatives. The heart rate, respiratory                         rate, oxygen saturations, blood pressure, adequacy of                         pulmonary ventilation, and response to care were                         monitored throughout the procedure. The physical                         status of the patient was re-assessed after the                         procedure.                        After obtaining informed consent, the colonoscope was                         passed under direct vision. Throughout the procedure,                         the patient's blood pressure, pulse, and oxygen  saturations were monitored continuously. The                         Colonoscope was introduced through the anus and                         advanced to the the cecum, identified by appendiceal                         orifice and ileocecal valve. The colonoscopy was                         performed without difficulty. The patient tolerated                         the procedure well. The quality of the bowel                         preparation was evaluated using the BBPS [facility] Bowel                         Preparation Scale) with scores of: Right Colon = 2                         (minor amount of residual staining,  small fragments of                         stool and/or opaque liquid, but mucosa seen well),                         Transverse Colon = 3 (entire mucosa seen well with no                         residual staining, small fragments of stool or opaque                         liquid) and Left Colon = 3 (entire mucosa seen well                         with no residual staining, small fragments of stool or                         opaque liquid). The total BBPS score equals 8. Findings:      The perianal and digital rectal examinations were normal. Pertinent       negatives include normal sphincter tone and no palpable rectal lesions.      A 12 mm polyp was found in the transverse colon. The polyp was sessile.       The polyp was removed with a hot snare and cut into 4 pieces with snare       to suction it Resection and retrieval were complete.      A 6 mm polyp was found in the transverse colon. The polyp was sessile.       The polyp was removed with a cold snare. Resection and retrieval were       complete.      A 6 mm polyp was found in the descending colon.  The polyp was sessile.       The polyp was removed with a hot snare. Resection and retrieval were       complete.      A 10 mm polyp was found in the sigmoid colon. The polyp was sessile. The       polyp was removed with a hot snare. Resection and retrieval were       complete.      A few hyperplastic and sessile polyps were found in the rectum. The       polyps were small in size. These polyps were removed with a cold snare.       Resection and retrieval were complete.      The retroflexed view of the distal rectum and anal verge was normal and       showed mucosal prolapse below the dentate line. Impression:            - One 12 mm polyp in the transverse colon, removed                         with a hot snare. Resected and retrieved.                        - One 6 mm polyp in the transverse colon, removed with                          a cold snare. Resected and retrieved.                        - One 6 mm polyp in the descending colon, removed with                         a hot snare. Resected and retrieved.                        - One 10 mm polyp in the sigmoid colon, removed with a                         hot snare. Resected and retrieved.                        - A few small polyps in the rectum, removed with a                         cold snare. Resected and retrieved.                        - The distal rectum and anal verge are normal on                         retroflexion view. Recommendation:        - Discharge patient to home (with escort).                        - Resume previous diet today.                        - Continue present medications.                        -  Await pathology results.                        - Repeat colonoscopy in 1 year for surveillance of                         multiple polyps and given suboptimal prep in right                         colon. Procedure Code(s):     --- Professional ---                        [phone], Colonoscopy, flexible; with removal of                         tumor(s), polyp(s), or other lesion(s) by snare                         technique Diagnosis Code(s):     --- Professional ---                        Z12.11, Encounter for screening for malignant neoplasm                         of colon                        K63.5, Polyp of colon                        K62.1, Rectal polyp CPT copyright 2019 American Medical Association. All rights reserved. The codes documented in this report are preliminary and upon coder review may  be revised to meet current compliance requirements. Dr. Ulyess Mort Lin Landsman MD, MD 06/05/2019 10:03:18 AM This report has been signed electronically. Number of Addenda: 0 Note Initiated On: 06/05/2019 9:09 AM Scope Withdrawal Time: 0 hours 27 minutes 17 seconds  Total Procedure Duration: 0 hours 31 minutes 52 seconds   Estimated Blood Loss:  Estimated blood loss: none.      [facility]

## 2019-06-05 NOTE — Anesthesia Post-op Follow-up Note (Signed)
Anesthesia QCDR form completed.        

## 2019-06-05 NOTE — Anesthesia Procedure Notes (Signed)
Date/Time: 06/05/2019 9:19 AM Performed by: Johnna Acosta, CRNA Pre-anesthesia Checklist: Patient identified, Emergency Drugs available, Suction available, Patient being monitored and Timeout performed Patient Re-evaluated:Patient Re-evaluated prior to induction Oxygen Delivery Method: Supernova nasal CPAP Preoxygenation: Pre-oxygenation with 100% oxygen Induction Type: IV induction

## 2019-06-06 ENCOUNTER — Encounter: Payer: Self-pay | Admitting: Gastroenterology

## 2019-06-06 LAB — SURGICAL PATHOLOGY

## 2019-06-19 ENCOUNTER — Other Ambulatory Visit: Payer: Self-pay

## 2019-06-19 DIAGNOSIS — Z20822 Contact with and (suspected) exposure to covid-19: Secondary | ICD-10-CM

## 2019-06-21 LAB — NOVEL CORONAVIRUS, NAA: SARS-CoV-2, NAA: DETECTED — AB

## 2019-06-22 ENCOUNTER — Telehealth: Payer: Self-pay | Admitting: Nurse Practitioner
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
## 2019-06-23 ENCOUNTER — Emergency Department: Payer: Medicare Other

## 2019-06-23 ENCOUNTER — Other Ambulatory Visit: Payer: Self-pay

## 2019-06-23 ENCOUNTER — Emergency Department
Admission: EM | Admit: 2019-06-23 | Discharge: 2019-06-24 | Disposition: A | Discharge status: Home / Self Care | Payer: Medicare Other | Attending: Emergency Medicine | Admitting: Emergency Medicine

## 2019-06-23 DIAGNOSIS — J449 Chronic obstructive pulmonary disease, unspecified: Secondary | ICD-10-CM | POA: Insufficient documentation

## 2019-06-23 DIAGNOSIS — Z79899 Other long term (current) drug therapy: Secondary | ICD-10-CM | POA: Insufficient documentation

## 2019-06-23 DIAGNOSIS — R0602 Shortness of breath: Secondary | ICD-10-CM | POA: Insufficient documentation

## 2019-06-23 DIAGNOSIS — U071 COVID-19: Secondary | ICD-10-CM | POA: Diagnosis not present

## 2019-06-23 DIAGNOSIS — I10 Essential (primary) hypertension: Secondary | ICD-10-CM | POA: Diagnosis not present

## 2019-06-23 DIAGNOSIS — Z87891 Personal history of nicotine dependence: Secondary | ICD-10-CM | POA: Diagnosis not present

## 2019-06-23 LAB — BASIC METABOLIC PANEL
Anion gap: 12 (ref 5–15)
BUN: 18 mg/dL (ref 6–20)
CO2: 24 mmol/L (ref 22–32)
Calcium: 9 mg/dL (ref 8.9–10.3)
Chloride: 103 mmol/L (ref 98–111)
Creatinine, Ser: 1.12 mg/dL — ABNORMAL HIGH (ref 0.44–1.00)
GFR calc Af Amer: >60 mL/min (ref >60)
GFR calc non Af Amer: 56 mL/min — ABNORMAL LOW (ref >60)
Glucose, Bld: 132 mg/dL — ABNORMAL HIGH (ref 70–99)
Potassium: 3.2 mmol/L — ABNORMAL LOW (ref 3.5–5.1)
Sodium: 139 mmol/L (ref 135–145)

## 2019-06-23 LAB — CBC
HCT: 44 % (ref 36.0–46.0)
Hemoglobin: 15.2 g/dL — ABNORMAL HIGH (ref 12.0–15.0)
MCH: 29.2 pg (ref 26.0–34.0)
MCHC: 34.5 g/dL (ref 30.0–36.0)
MCV: 84.5 fL (ref 80.0–100.0)
Platelets: 240 10*3/uL (ref 150–400)
RBC: 5.21 MIL/uL — ABNORMAL HIGH (ref 3.87–5.11)
RDW: 12.3 % (ref 11.5–15.5)
WBC: 7.9 10*3/uL (ref 4.0–10.5)
nRBC: 0 % (ref 0.0–0.2)

## 2019-06-23 LAB — FIBRIN DERIVATIVES D-DIMER (ARMC ONLY): Fibrin derivatives D-dimer (ARMC): 386.59 ng/mL (FEU) (ref 0.00–499.00)

## 2019-06-23 LAB — TROPONIN I (HIGH SENSITIVITY): Troponin I (High Sensitivity): 4 ng/L (ref <18)

## 2019-06-23 MED ORDER — SODIUM CHLORIDE 0.9 % IV BOLUS
1000.0000 mL | Freq: Once | INTRAVENOUS | Status: AC
  Administered 2019-06-23: 1000 mL via INTRAVENOUS

## 2019-06-23 MED ORDER — IBUPROFEN 800 MG PO TABS
800.0000 mg | ORAL_TABLET | ORAL | Status: AC
  Administered 2019-06-23: 800 mg via ORAL
  Filled 2019-06-23: qty 1

## 2019-06-23 MED ORDER — OXYCODONE-ACETAMINOPHEN 5-325 MG PO TABS
2.0000 | ORAL_TABLET | ORAL | Status: AC
  Administered 2019-06-23: 23:00:00 2 via ORAL
  Filled 2019-06-23: qty 2

## 2019-06-23 NOTE — ED Notes (Signed)
Patient asking about wait time. Patient given update on wait time.
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
## 2019-06-23 NOTE — ED Triage Notes (Signed)
Pt is covid positive as of Monday and states that starting today she's been having sharp pains in her chest when she breathes. Pt feels dizzy when she walks.

## 2019-06-23 NOTE — ED Triage Notes (Signed)
Pt going in and out of trigeminy on EKG.

## 2019-08-30 DIAGNOSIS — F4310 Post-traumatic stress disorder, unspecified: Secondary | ICD-10-CM | POA: Diagnosis not present

## 2019-08-30 DIAGNOSIS — M5410 Radiculopathy, site unspecified: Secondary | ICD-10-CM | POA: Diagnosis not present

## 2019-08-30 DIAGNOSIS — Z79899 Other long term (current) drug therapy: Secondary | ICD-10-CM | POA: Diagnosis not present

## 2019-08-30 DIAGNOSIS — F603 Borderline personality disorder: Secondary | ICD-10-CM | POA: Diagnosis not present

## 2019-09-04 DIAGNOSIS — R001 Bradycardia, unspecified: Secondary | ICD-10-CM | POA: Diagnosis not present

## 2019-09-04 DIAGNOSIS — R0602 Shortness of breath: Secondary | ICD-10-CM | POA: Diagnosis not present

## 2019-09-04 DIAGNOSIS — I10 Essential (primary) hypertension: Secondary | ICD-10-CM | POA: Diagnosis not present

## 2019-09-04 DIAGNOSIS — F1190 Opioid use, unspecified, uncomplicated: Secondary | ICD-10-CM | POA: Diagnosis not present

## 2019-09-04 DIAGNOSIS — Z6841 Body Mass Index (BMI) 40.0 and over, adult: Secondary | ICD-10-CM | POA: Diagnosis not present

## 2019-09-04 DIAGNOSIS — E876 Hypokalemia: Secondary | ICD-10-CM | POA: Diagnosis not present

## 2019-09-04 DIAGNOSIS — I493 Ventricular premature depolarization: Secondary | ICD-10-CM | POA: Diagnosis not present

## 2019-09-12 ENCOUNTER — Ambulatory Visit
Admission: RE | Admit: 2019-09-12 | Discharge: 2019-09-12 | Disposition: A | Discharge status: Home / Self Care | Payer: Medicare HMO | Source: Ambulatory Visit | Attending: Family Medicine | Admitting: Family Medicine

## 2019-09-12 DIAGNOSIS — Z1231 Encounter for screening mammogram for malignant neoplasm of breast: Secondary | ICD-10-CM | POA: Diagnosis not present

## 2019-09-27 DIAGNOSIS — Z79899 Other long term (current) drug therapy: Secondary | ICD-10-CM | POA: Diagnosis not present

## 2019-09-27 DIAGNOSIS — M5410 Radiculopathy, site unspecified: Secondary | ICD-10-CM | POA: Diagnosis not present

## 2019-09-27 DIAGNOSIS — F4310 Post-traumatic stress disorder, unspecified: Secondary | ICD-10-CM | POA: Diagnosis not present

## 2019-09-27 DIAGNOSIS — F603 Borderline personality disorder: Secondary | ICD-10-CM | POA: Diagnosis not present

## 2019-11-01 DIAGNOSIS — M5410 Radiculopathy, site unspecified: Secondary | ICD-10-CM | POA: Diagnosis not present

## 2019-11-01 DIAGNOSIS — F4310 Post-traumatic stress disorder, unspecified: Secondary | ICD-10-CM | POA: Diagnosis not present

## 2019-11-01 DIAGNOSIS — F603 Borderline personality disorder: Secondary | ICD-10-CM | POA: Diagnosis not present

## 2019-11-29 DIAGNOSIS — F4310 Post-traumatic stress disorder, unspecified: Secondary | ICD-10-CM | POA: Diagnosis not present

## 2019-11-29 DIAGNOSIS — M5410 Radiculopathy, site unspecified: Secondary | ICD-10-CM | POA: Diagnosis not present

## 2019-11-29 DIAGNOSIS — Z79891 Long term (current) use of opiate analgesic: Secondary | ICD-10-CM | POA: Diagnosis not present

## 2019-11-29 DIAGNOSIS — F603 Borderline personality disorder: Secondary | ICD-10-CM | POA: Diagnosis not present

## 2019-12-01 DIAGNOSIS — E785 Hyperlipidemia, unspecified: Secondary | ICD-10-CM | POA: Diagnosis not present

## 2019-12-01 DIAGNOSIS — I10 Essential (primary) hypertension: Secondary | ICD-10-CM | POA: Diagnosis not present

## 2019-12-01 DIAGNOSIS — R7309 Other abnormal glucose: Secondary | ICD-10-CM | POA: Diagnosis not present

## 2019-12-01 DIAGNOSIS — E039 Hypothyroidism, unspecified: Secondary | ICD-10-CM | POA: Diagnosis not present

## 2019-12-07 DIAGNOSIS — Z0000 Encounter for general adult medical examination without abnormal findings: Secondary | ICD-10-CM | POA: Diagnosis not present

## 2019-12-07 DIAGNOSIS — Z1389 Encounter for screening for other disorder: Secondary | ICD-10-CM | POA: Diagnosis not present

## 2019-12-27 DIAGNOSIS — M5410 Radiculopathy, site unspecified: Secondary | ICD-10-CM | POA: Diagnosis not present

## 2019-12-27 DIAGNOSIS — F603 Borderline personality disorder: Secondary | ICD-10-CM | POA: Diagnosis not present

## 2019-12-27 DIAGNOSIS — Z79899 Other long term (current) drug therapy: Secondary | ICD-10-CM | POA: Diagnosis not present

## 2019-12-27 DIAGNOSIS — F4310 Post-traumatic stress disorder, unspecified: Secondary | ICD-10-CM | POA: Diagnosis not present

## 2020-01-31 DIAGNOSIS — M5410 Radiculopathy, site unspecified: Secondary | ICD-10-CM | POA: Diagnosis not present

## 2020-01-31 DIAGNOSIS — F603 Borderline personality disorder: Secondary | ICD-10-CM | POA: Diagnosis not present

## 2020-01-31 DIAGNOSIS — F4310 Post-traumatic stress disorder, unspecified: Secondary | ICD-10-CM | POA: Diagnosis not present

## 2020-01-31 DIAGNOSIS — Z79899 Other long term (current) drug therapy: Secondary | ICD-10-CM | POA: Diagnosis not present

## 2020-02-18 DIAGNOSIS — M5410 Radiculopathy, site unspecified: Secondary | ICD-10-CM | POA: Diagnosis not present

## 2020-02-18 DIAGNOSIS — F4310 Post-traumatic stress disorder, unspecified: Secondary | ICD-10-CM | POA: Diagnosis not present

## 2020-02-18 DIAGNOSIS — F603 Borderline personality disorder: Secondary | ICD-10-CM | POA: Diagnosis not present

## 2020-02-28 DIAGNOSIS — Z79899 Other long term (current) drug therapy: Secondary | ICD-10-CM | POA: Diagnosis not present

## 2020-03-06 DIAGNOSIS — F419 Anxiety disorder, unspecified: Secondary | ICD-10-CM | POA: Diagnosis not present

## 2020-03-06 DIAGNOSIS — F331 Major depressive disorder, recurrent, moderate: Secondary | ICD-10-CM | POA: Diagnosis not present

## 2020-03-07 DIAGNOSIS — I493 Ventricular premature depolarization: Secondary | ICD-10-CM | POA: Diagnosis not present

## 2020-03-07 DIAGNOSIS — I10 Essential (primary) hypertension: Secondary | ICD-10-CM | POA: Diagnosis not present

## 2020-03-07 DIAGNOSIS — Z6841 Body Mass Index (BMI) 40.0 and over, adult: Secondary | ICD-10-CM | POA: Diagnosis not present

## 2020-03-07 DIAGNOSIS — J449 Chronic obstructive pulmonary disease, unspecified: Secondary | ICD-10-CM | POA: Diagnosis not present

## 2020-03-27 DIAGNOSIS — F419 Anxiety disorder, unspecified: Secondary | ICD-10-CM | POA: Diagnosis not present

## 2020-03-27 DIAGNOSIS — F603 Borderline personality disorder: Secondary | ICD-10-CM | POA: Diagnosis not present

## 2020-03-27 DIAGNOSIS — M5410 Radiculopathy, site unspecified: Secondary | ICD-10-CM | POA: Diagnosis not present

## 2020-03-27 DIAGNOSIS — F4310 Post-traumatic stress disorder, unspecified: Secondary | ICD-10-CM | POA: Diagnosis not present

## 2020-03-27 DIAGNOSIS — Z79899 Other long term (current) drug therapy: Secondary | ICD-10-CM | POA: Diagnosis not present

## 2020-03-27 DIAGNOSIS — F331 Major depressive disorder, recurrent, moderate: Secondary | ICD-10-CM | POA: Diagnosis not present

## 2020-05-01 DIAGNOSIS — F419 Anxiety disorder, unspecified: Secondary | ICD-10-CM | POA: Diagnosis not present

## 2020-05-01 DIAGNOSIS — F331 Major depressive disorder, recurrent, moderate: Secondary | ICD-10-CM | POA: Diagnosis not present

## 2020-05-01 DIAGNOSIS — Z79899 Other long term (current) drug therapy: Secondary | ICD-10-CM | POA: Diagnosis not present

## 2020-05-29 DIAGNOSIS — I10 Essential (primary) hypertension: Secondary | ICD-10-CM | POA: Diagnosis not present

## 2020-05-29 DIAGNOSIS — F411 Generalized anxiety disorder: Secondary | ICD-10-CM | POA: Diagnosis not present

## 2020-05-29 DIAGNOSIS — F331 Major depressive disorder, recurrent, moderate: Secondary | ICD-10-CM | POA: Diagnosis not present

## 2020-05-29 DIAGNOSIS — Z79899 Other long term (current) drug therapy: Secondary | ICD-10-CM | POA: Diagnosis not present

## 2020-05-29 DIAGNOSIS — F3132 Bipolar disorder, current episode depressed, moderate: Secondary | ICD-10-CM | POA: Diagnosis not present

## 2020-05-29 DIAGNOSIS — G8929 Other chronic pain: Secondary | ICD-10-CM | POA: Diagnosis not present

## 2020-05-29 DIAGNOSIS — F419 Anxiety disorder, unspecified: Secondary | ICD-10-CM | POA: Diagnosis not present

## 2020-06-26 DIAGNOSIS — F331 Major depressive disorder, recurrent, moderate: Secondary | ICD-10-CM | POA: Diagnosis not present

## 2020-06-26 DIAGNOSIS — F419 Anxiety disorder, unspecified: Secondary | ICD-10-CM | POA: Diagnosis not present

## 2020-06-26 DIAGNOSIS — Z79899 Other long term (current) drug therapy: Secondary | ICD-10-CM | POA: Diagnosis not present

## 2020-07-31 DIAGNOSIS — Z79899 Other long term (current) drug therapy: Secondary | ICD-10-CM | POA: Diagnosis not present

## 2020-08-16 IMAGING — MR MRI CERVICAL SPINE WITHOUT CONTRAST
5 series · 38 of 48 positions shown · non-contrast
Comparison: Cervical spine CT 01/08/2019.

CLINICAL DATA: 52-year-old female with 3-4 weeks of neck pain
radiating to the right arm and fingers. Remote prior surgery.

EXAM:
MRI CERVICAL SPINE WITHOUT CONTRAST
TECHNIQUE: Multiplanar, multisequence MR imaging of the cervical spine was
performed. No intravenous contrast was administered.

[Series 5: T2 · sagittal · 3.0mm · 0.62mm/px · 7 of 15 slices shown (1 of 2)]
[im 1/15]
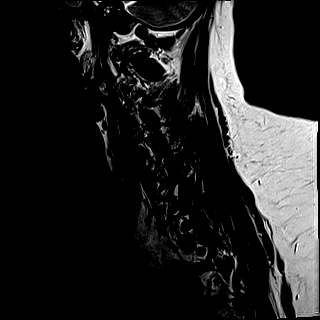
[im 3/15]
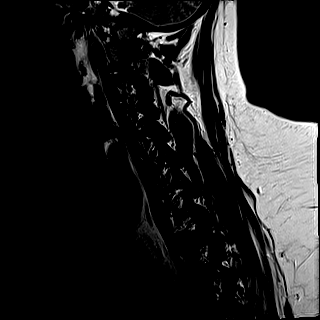
[im 5/15]
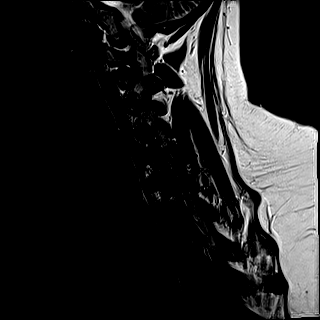
[im 8/15]
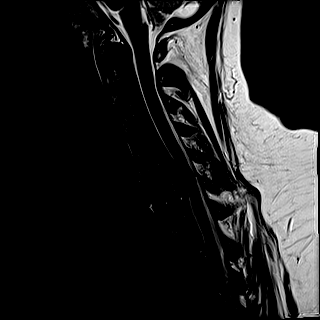
[im 10/15]
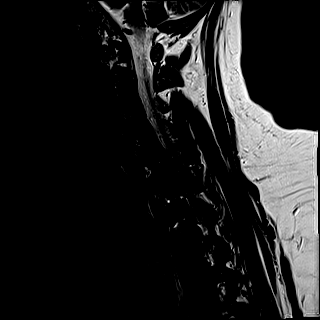
[im 12/15]
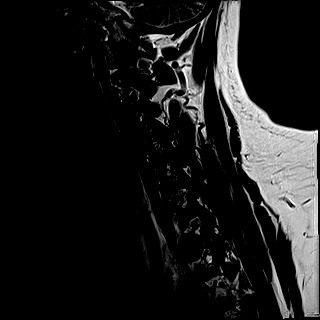
[im 15/15]
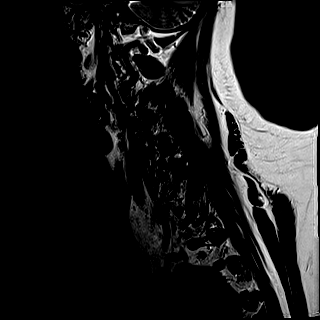

[Series 6: FLAIR · sagittal · 3.0mm · 0.78mm/px · 7 of 15 slices shown]
[im 1/15]
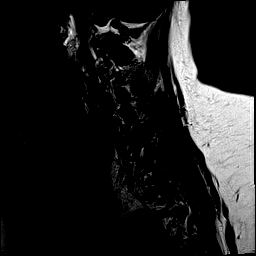
[im 3/15]
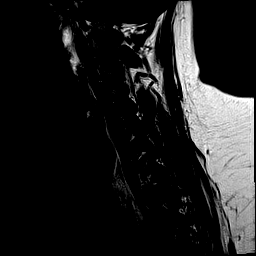
[im 5/15]
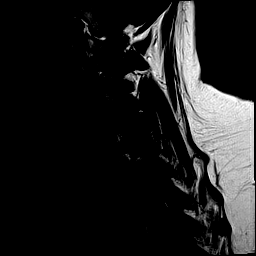
[im 8/15]
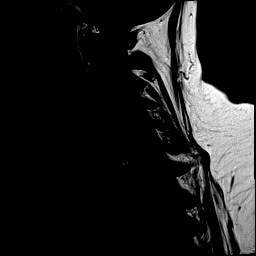
[im 10/15]
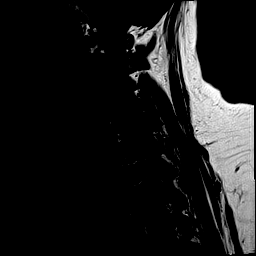
[im 12/15]
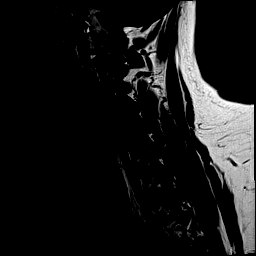
[im 15/15]
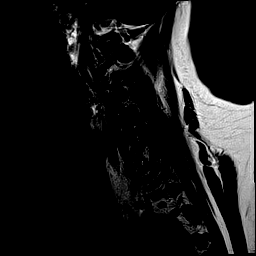

[Series 7: STIR · sagittal · 3.0mm · 0.62mm/px · 6 of 15 slices shown]
[im 1/15]
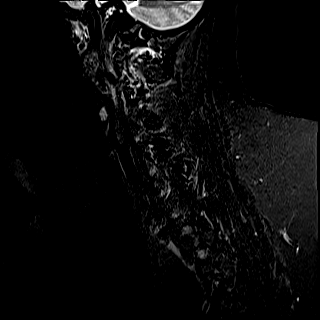
[im 3/15]
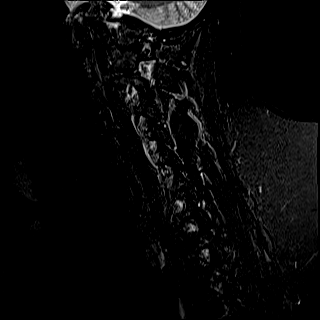
[im 6/15]
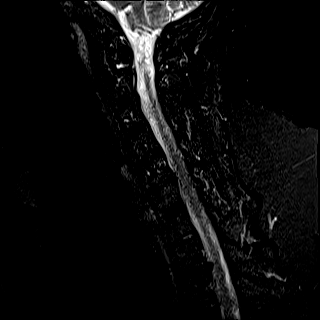
[im 9/15]
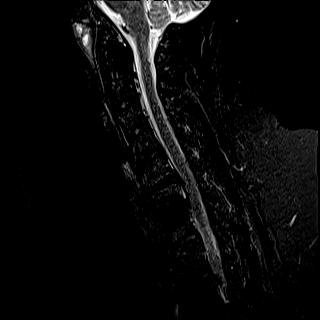
[im 12/15]
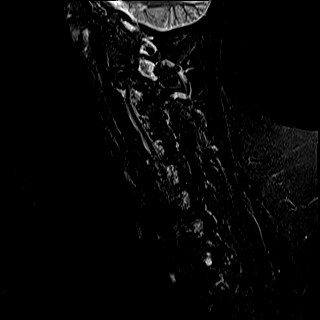
[im 15/15]
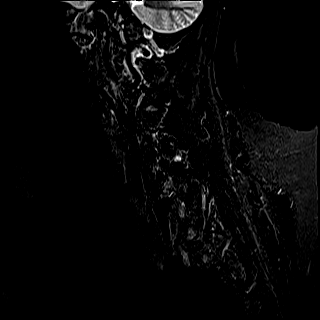

[Series 8: T2 · axial · 3.0mm · 0.70mm/px · z∈[-156,-59]mm · 10 of 33 slices shown (2 of 2)]
[im 1/33]
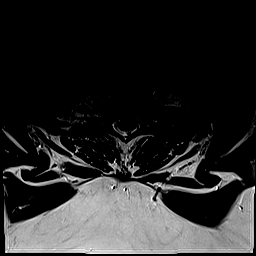
[im 3/33]
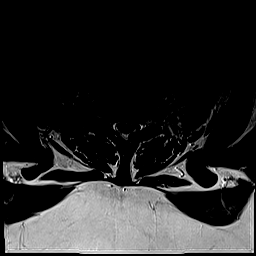
[im 5/33]
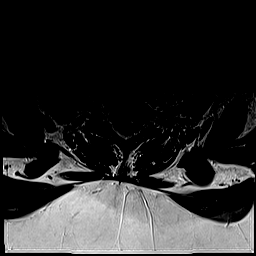
[im 8/33]
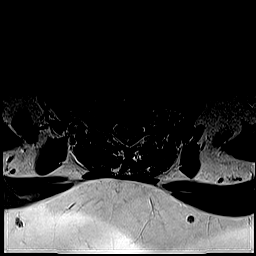
[im 10/33]
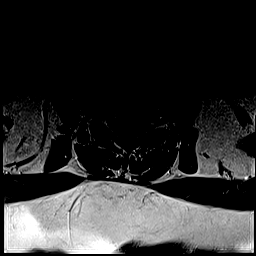
[im 15/33]
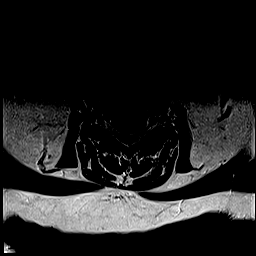
[im 18/33]
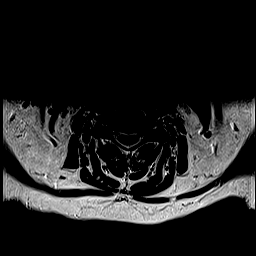
[im 23/33]
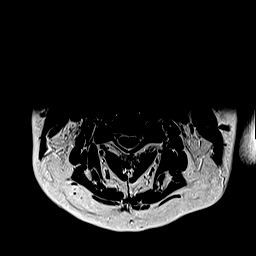
[im 28/33]
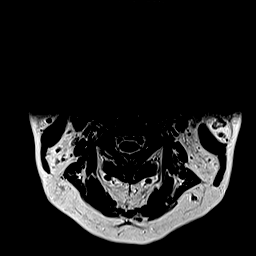
[im 33/33]
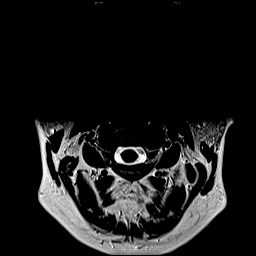

[Series 9: ax mpgr · axial · 3.0mm · 0.35mm/px · z∈[-156,-59]mm · 8 of 33 slices shown]
[im 1/33]
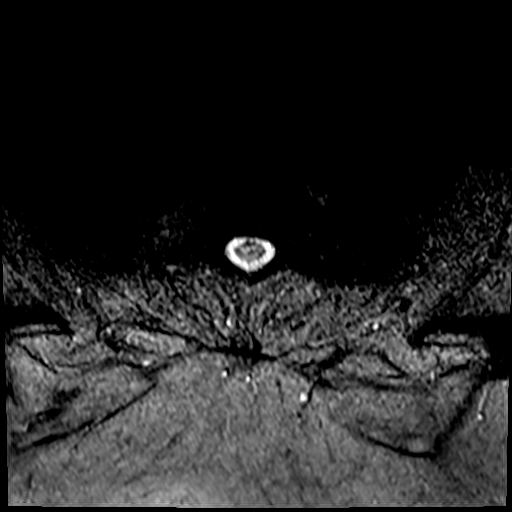
[im 5/33]
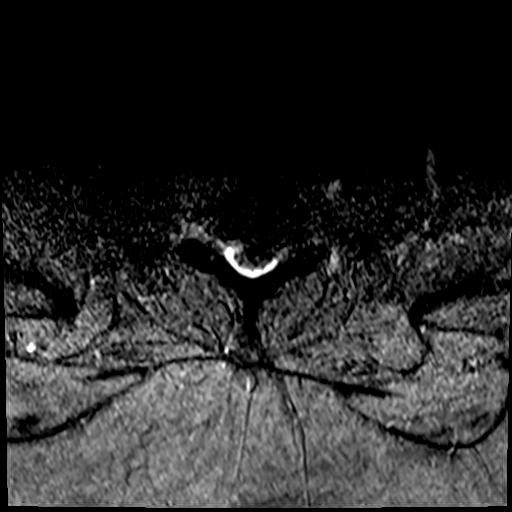
[im 10/33]
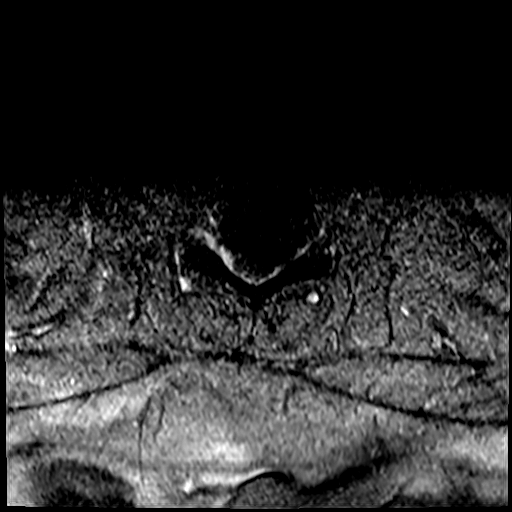
[im 15/33]
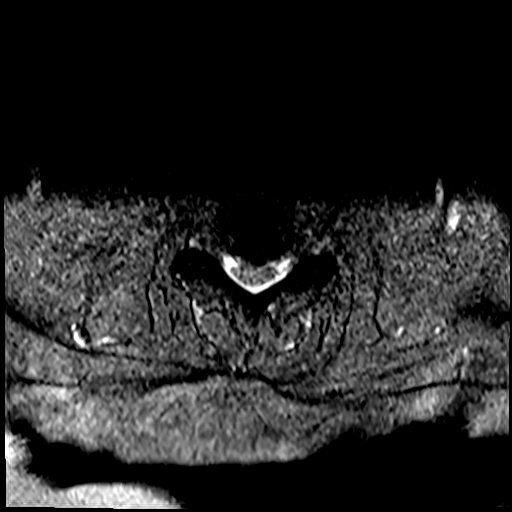
[im 18/33]
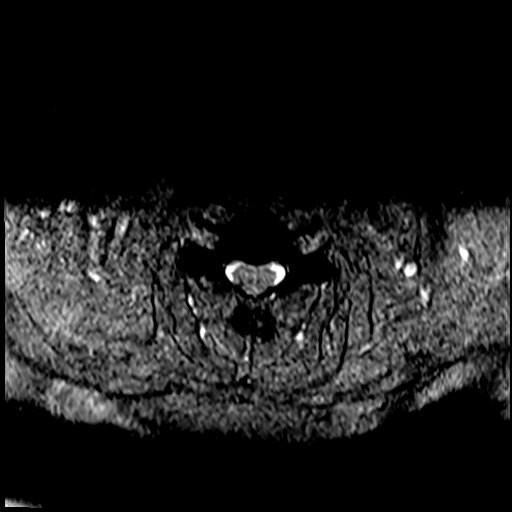
[im 23/33]
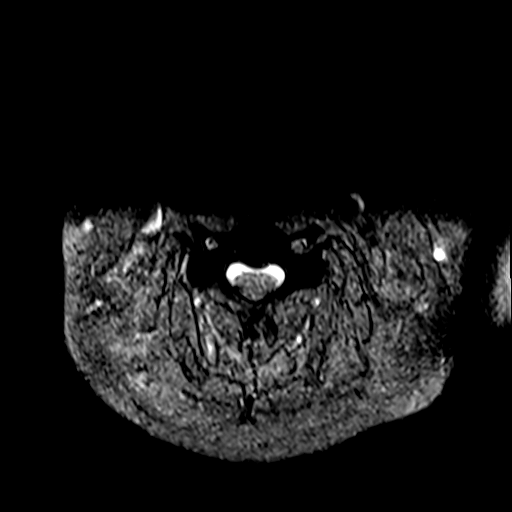
[im 28/33]
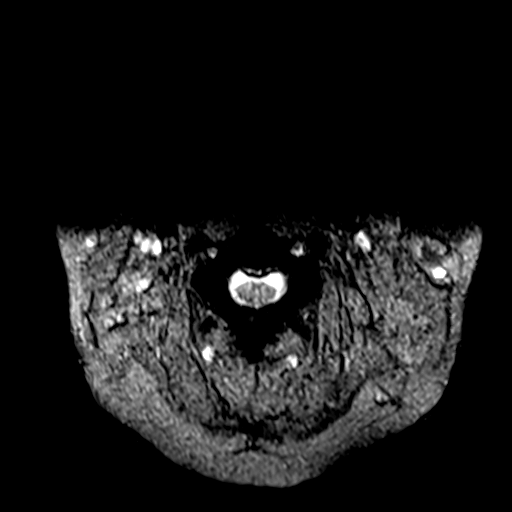
[im 33/33]
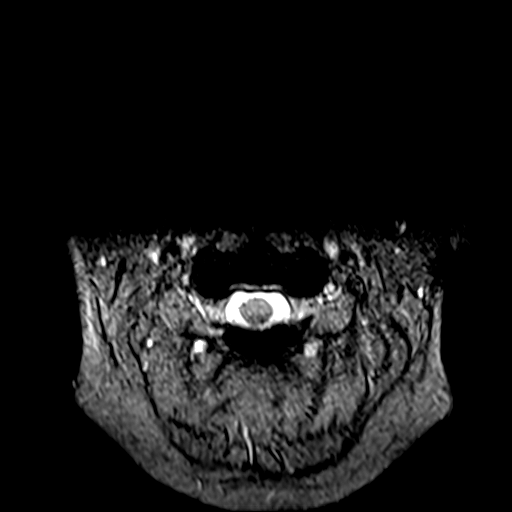

[38 of 48 positions shown; findings below may reference images not displayed]

FINDINGS: Alignment: Stable straightening and mild reversal of lordosis since
[REDACTED].

Vertebrae: Hardware susceptibility artifact from C6-C7 interbody
implant or spacer. No marrow edema or evidence of acute osseous
abnormality.

Cord: No definite spinal cord signal abnormality despite mass effect
at C5-C6, detailed below. Elsewhere normal spinal cord signal and
morphology.

Posterior Fossa, vertebral arteries, paraspinal tissues:
Cervicomedullary junction is within normal limits. Negative visible
posterior fossa. Preserved major vascular flow voids, the left
vertebral artery is dominant. Negative neck soft tissues and visible
lung apices.

Disc levels:

C2-C3:  Negative.

C3-C4:  Negative.

C4-C5:  Mild anterior disc bulging.  Otherwise negative.

C5-C6: Bulky posterior and slightly caudal disc extrusion best seen
on series 5, image 8. Spinal stenosis with mild to moderate spinal
cord mass effect. There is far lateral disc bulging and endplate
spurring, but no foraminal stenosis.

C6-C7:  Prior interbody implant or fusion.  No stenosis.

C7-T1:  Mild facet hypertrophy.  No stenosis.

T1-T2: Small right paracentral disc protrusion is visible on series
5, image 6. No definite stenosis.
IMPRESSION: 1. Bulky midline disc herniation at C5-C6 with spinal stenosis and
mild to moderate spinal cord mass effect. No cord signal
abnormality.
2. Previous fusion at the adjacent C6-C7 segment with no adverse
features.
3. Small right paracentral upper thoracic disc protrusion at T1-T2
with no definite stenosis.

These results will be called to the ordering clinician or
representative by the Radiologist Assistant, and communication
documented in the PACS or zVision Dashboard.

## 2020-09-28 IMAGING — CR CERVICAL SPINE - 2-3 VIEW
2 series · 2 of 2 positions shown · non-contrast
Comparison: 03/13/2019.  MRI 01/30/2019.

CLINICAL DATA: Anterior cervical scratched it cervical spine
surgery.

EXAM:
CERVICAL SPINE - 2-3 VIEW

[c-spine lat]
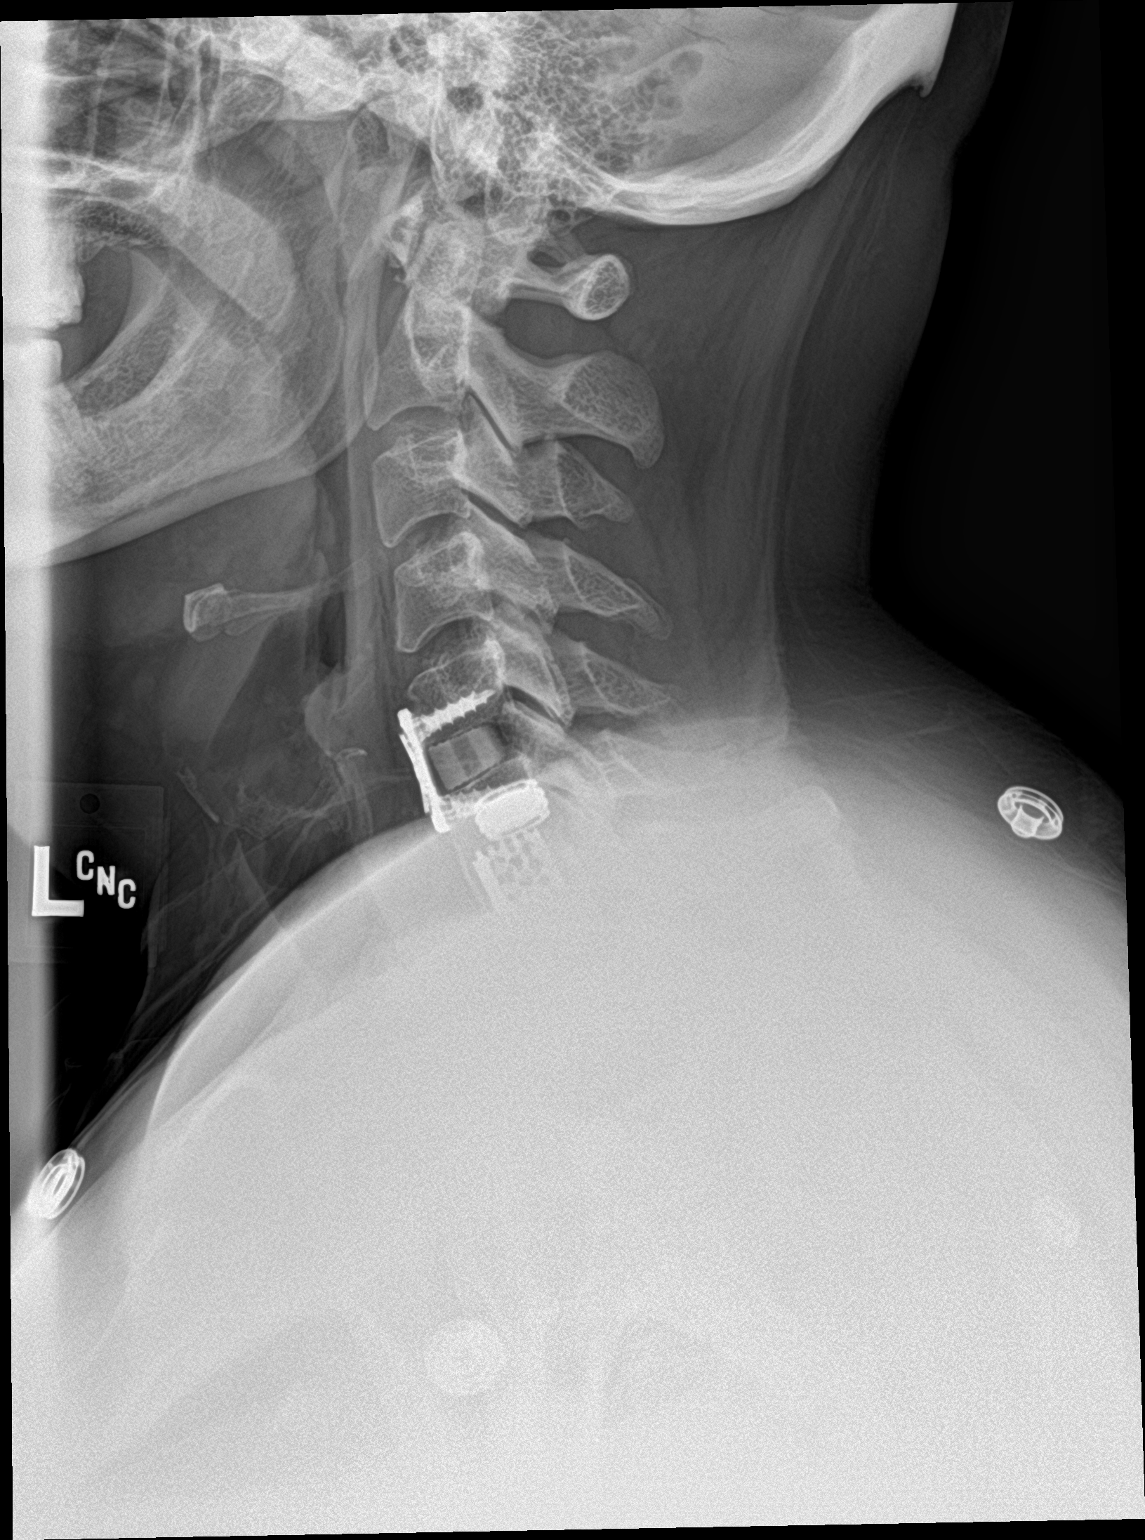

[c-spine ap]
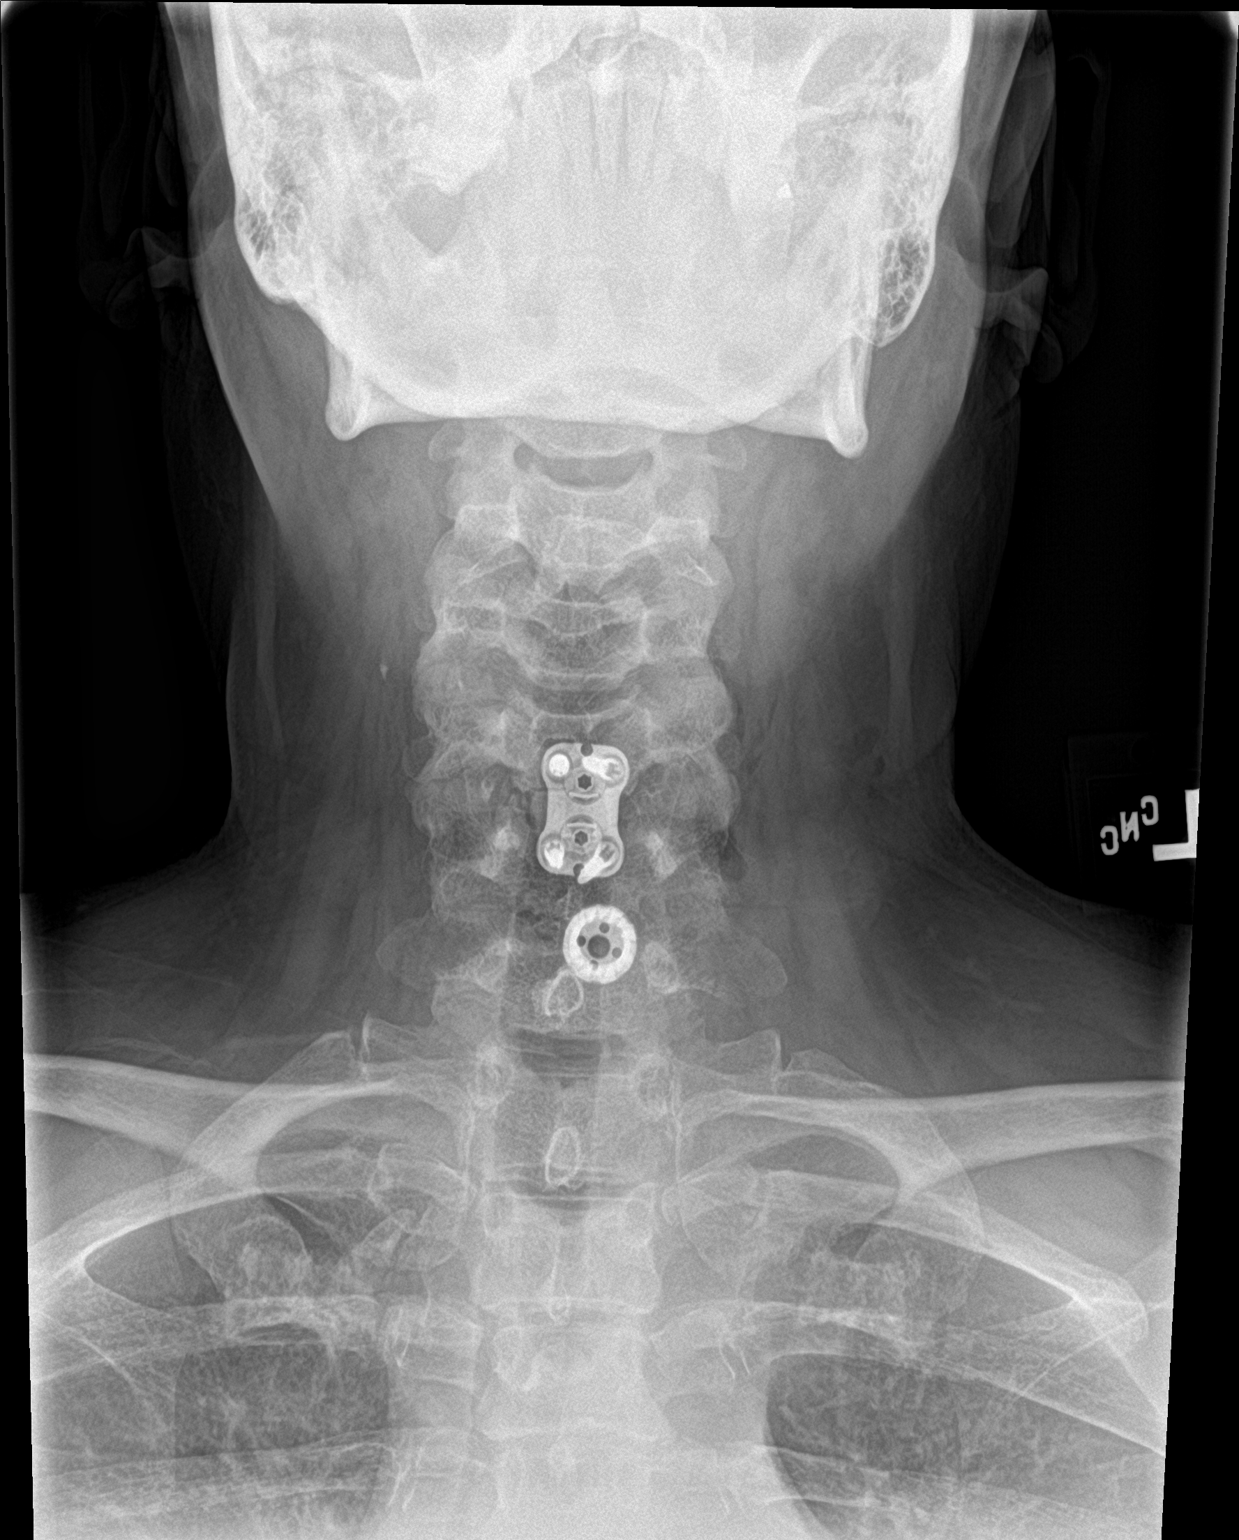

[2 of 2 positions shown; findings below may reference images not displayed]

FINDINGS: Limited visualization of C7 on lateral view. C5-C6 anterior and
interbody fusion. Hardware intact. Anatomic alignment. Postsurgical
changes C7. Hardware intact. No acute bony abnormality. Mild
biapical pleural thickening noted most likely scarring. Carotid
vascular calcification cannot be excluded.
IMPRESSION: 1. Postsurgical changes cervical spine. Hardware intact. Anatomic
alignment.

2.  Carotid vascular disease cannot be excluded.

## 2020-11-27 DIAGNOSIS — Z79899 Other long term (current) drug therapy: Secondary | ICD-10-CM | POA: Diagnosis not present

## 2020-11-27 DIAGNOSIS — T402X5A Adverse effect of other opioids, initial encounter: Secondary | ICD-10-CM | POA: Diagnosis not present

## 2020-11-27 DIAGNOSIS — F411 Generalized anxiety disorder: Secondary | ICD-10-CM | POA: Diagnosis not present

## 2020-11-27 DIAGNOSIS — G8929 Other chronic pain: Secondary | ICD-10-CM | POA: Diagnosis not present

## 2020-11-27 DIAGNOSIS — I10 Essential (primary) hypertension: Secondary | ICD-10-CM | POA: Diagnosis not present

## 2020-11-27 DIAGNOSIS — K5903 Drug induced constipation: Secondary | ICD-10-CM | POA: Diagnosis not present

## 2020-11-27 DIAGNOSIS — F3132 Bipolar disorder, current episode depressed, moderate: Secondary | ICD-10-CM | POA: Diagnosis not present

## 2020-12-25 DIAGNOSIS — F3132 Bipolar disorder, current episode depressed, moderate: Secondary | ICD-10-CM | POA: Diagnosis not present

## 2020-12-25 DIAGNOSIS — F411 Generalized anxiety disorder: Secondary | ICD-10-CM | POA: Diagnosis not present

## 2020-12-25 DIAGNOSIS — I10 Essential (primary) hypertension: Secondary | ICD-10-CM | POA: Diagnosis not present

## 2020-12-25 DIAGNOSIS — G8929 Other chronic pain: Secondary | ICD-10-CM | POA: Diagnosis not present

## 2020-12-25 DIAGNOSIS — Z79899 Other long term (current) drug therapy: Secondary | ICD-10-CM | POA: Diagnosis not present

## 2021-01-07 IMAGING — US US EXTREM LOW VENOUS
1 series · 13 of 24 positions shown · non-contrast
Comparison: None.

CLINICAL DATA: Pleuritic chest pain. [id number] positive. Shortness
of breath.



[Series 1: us extrem low venous · 13 of 59 slices shown]
[im 1/59]
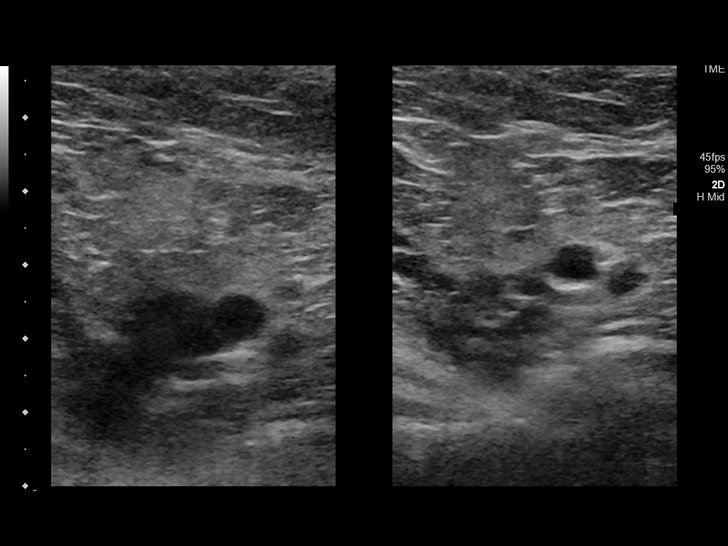
[im 6/59]
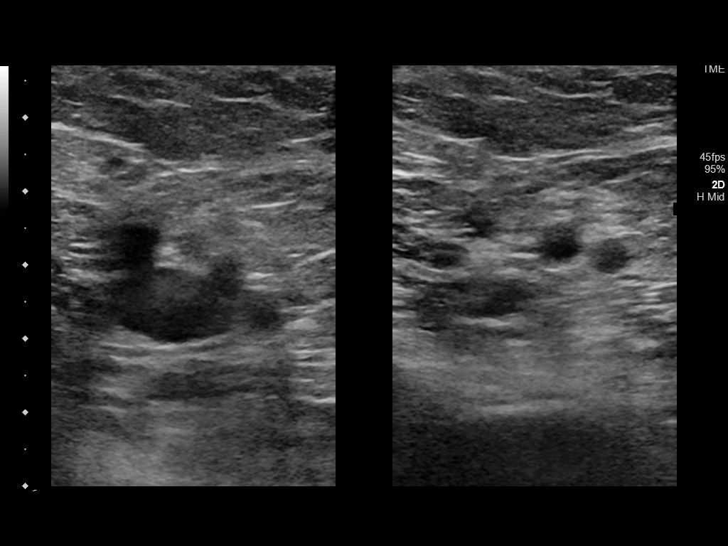
[im 11/59]
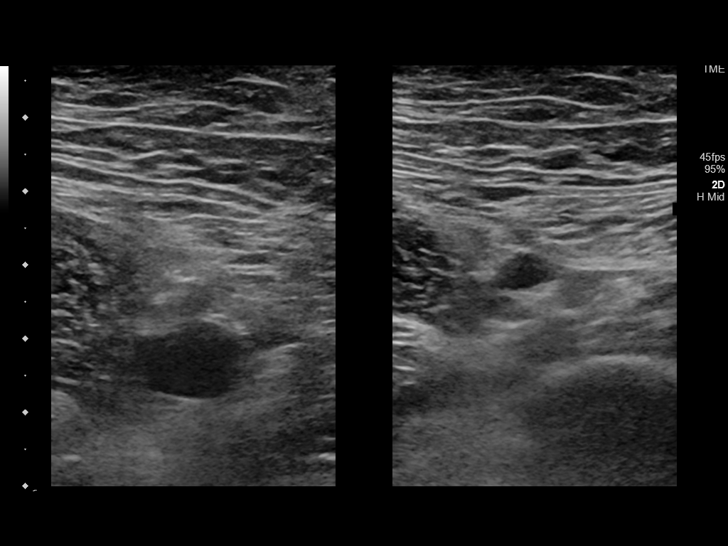
[im 16/59]
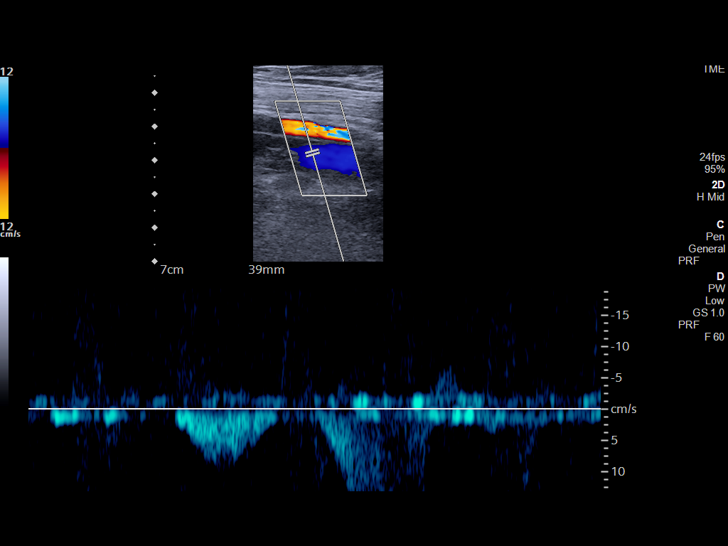
[im 21/59]
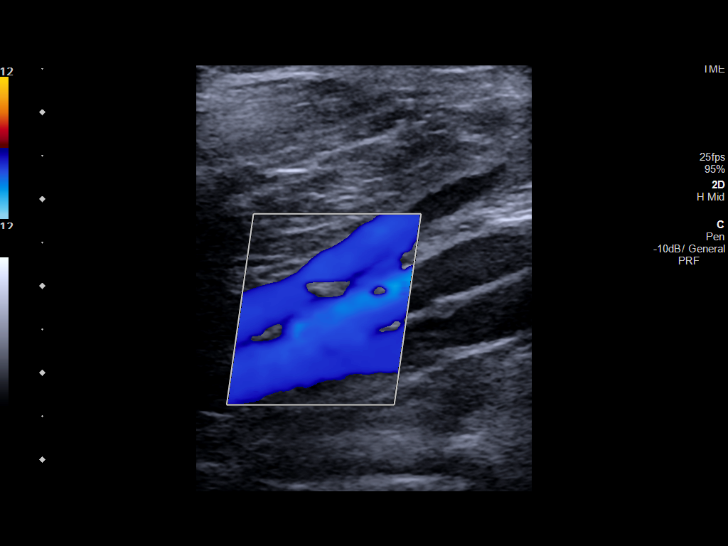
[im 26/59]
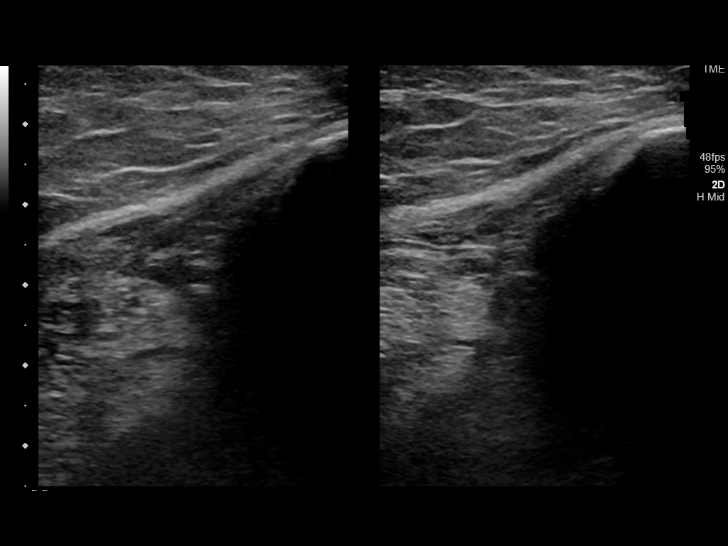
[im 31/59]
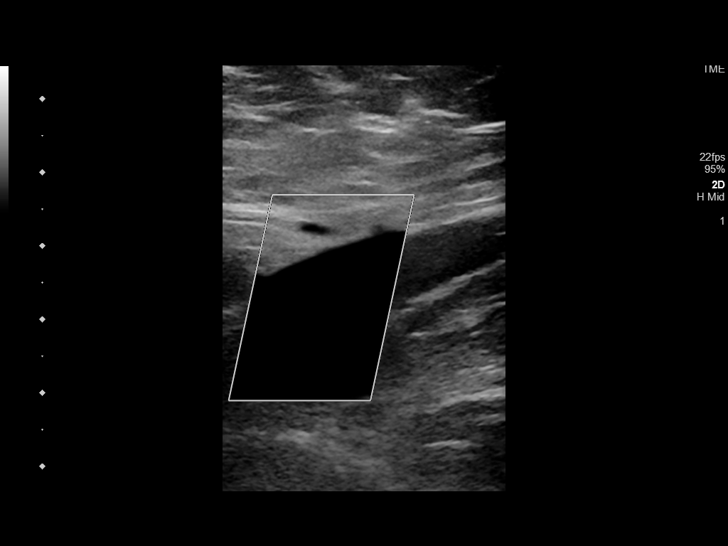
[im 33/59]
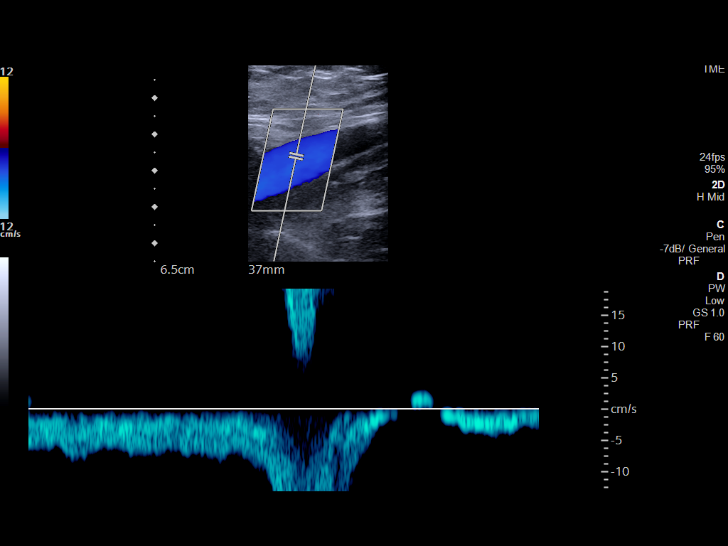
[im 38/59]
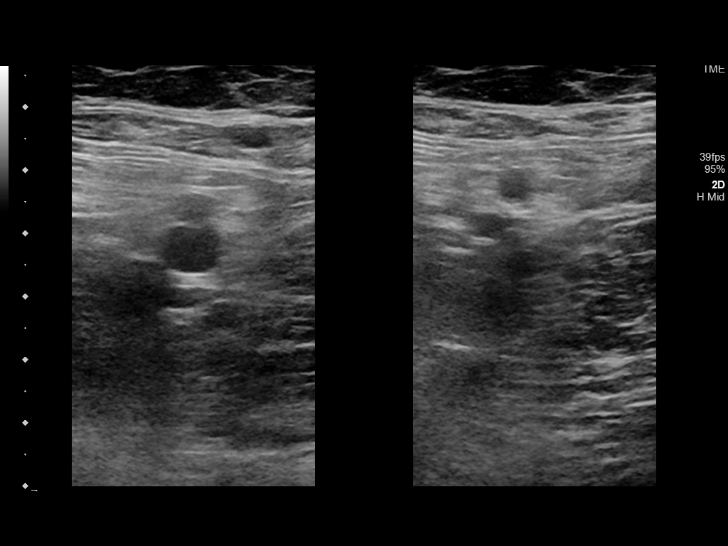
[im 43/59]
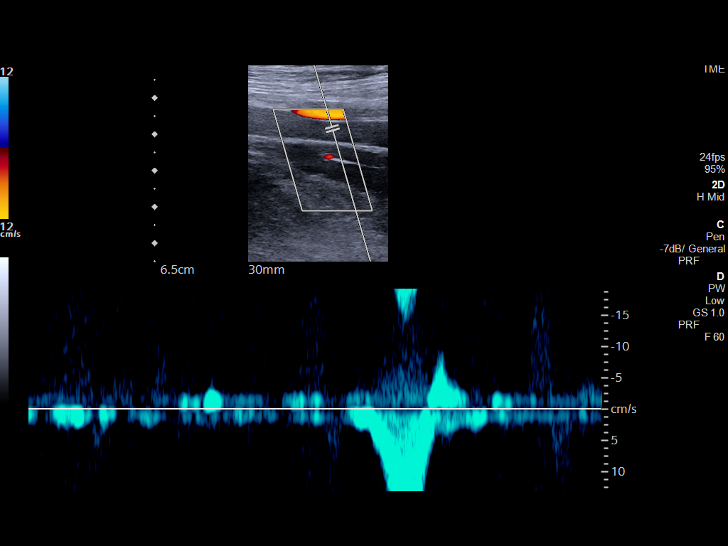
[im 48/59]
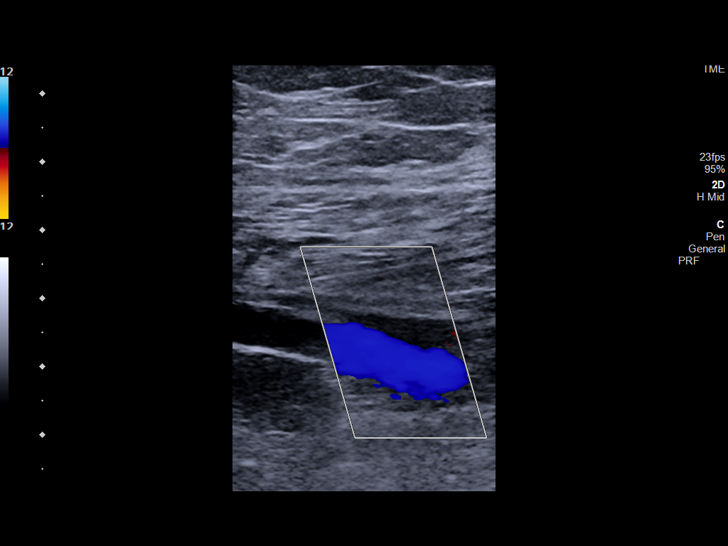
[im 53/59]
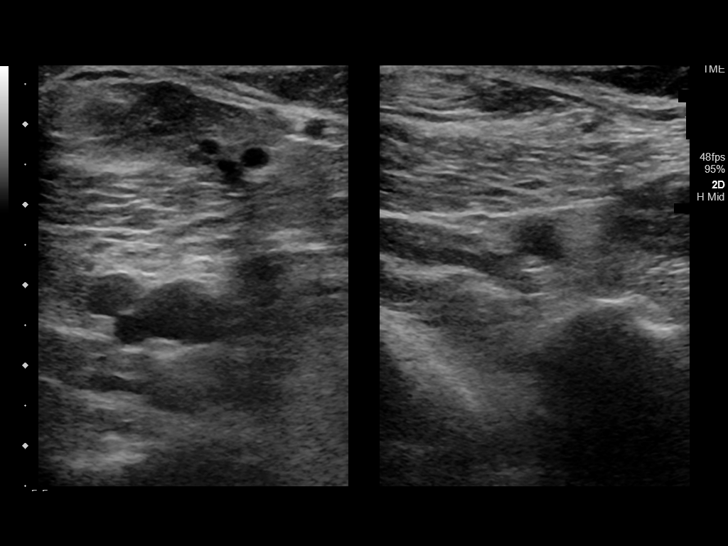
[im 59/59]
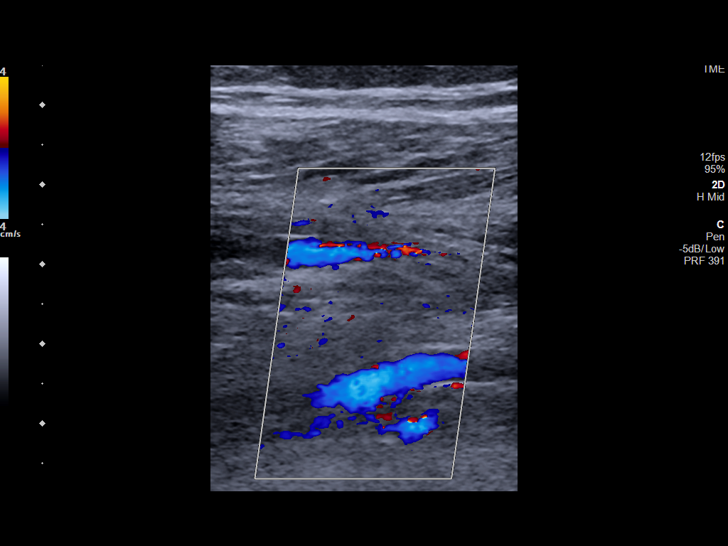

[13 of 24 positions shown; findings below may reference images not displayed]

FINDINGS: RIGHT LOWER EXTREMITY

Common Femoral Vein: No evidence of thrombus. Normal
compressibility, respiratory phasicity and response to augmentation.

Saphenofemoral Junction: No evidence of thrombus. Normal
compressibility and flow on color Doppler imaging.

Profunda Femoral Vein: No evidence of thrombus. Normal
compressibility and flow on color Doppler imaging.

Femoral Vein: No evidence of thrombus. Normal compressibility,
respiratory phasicity and response to augmentation.

Popliteal Vein: No evidence of thrombus. Normal compressibility,
respiratory phasicity and response to augmentation.

Calf Veins: No evidence of thrombus. Normal compressibility and flow
on color Doppler imaging.

Superficial Great Saphenous Vein: No evidence of thrombus. Normal
compressibility.

Venous Reflux:  None.

Other Findings:  None.

LEFT LOWER EXTREMITY

Common Femoral Vein: No evidence of thrombus. Normal
compressibility, respiratory phasicity and response to augmentation.

Saphenofemoral Junction: No evidence of thrombus. Normal
compressibility and flow on color Doppler imaging.

Profunda Femoral Vein: No evidence of thrombus. Normal
compressibility and flow on color Doppler imaging.

Femoral Vein: No evidence of thrombus. Normal compressibility,
respiratory phasicity and response to augmentation.

Popliteal Vein: No evidence of thrombus. Normal compressibility,
respiratory phasicity and response to augmentation.

Calf Veins: No evidence of thrombus. Normal compressibility and flow
on color Doppler imaging.

Superficial Great Saphenous Vein: No evidence of thrombus. Normal
compressibility.

Venous Reflux:  None.

Other Findings:  None.
IMPRESSION: No evidence of deep venous thrombosis in either lower extremity.

## 2021-02-11 DIAGNOSIS — F411 Generalized anxiety disorder: Secondary | ICD-10-CM | POA: Diagnosis not present

## 2021-02-11 DIAGNOSIS — F3132 Bipolar disorder, current episode depressed, moderate: Secondary | ICD-10-CM | POA: Diagnosis not present

## 2021-02-27 DIAGNOSIS — I10 Essential (primary) hypertension: Secondary | ICD-10-CM | POA: Diagnosis not present

## 2021-02-27 DIAGNOSIS — K5903 Drug induced constipation: Secondary | ICD-10-CM | POA: Diagnosis not present

## 2021-02-27 DIAGNOSIS — G8929 Other chronic pain: Secondary | ICD-10-CM | POA: Diagnosis not present

## 2021-02-27 DIAGNOSIS — T402X5A Adverse effect of other opioids, initial encounter: Secondary | ICD-10-CM | POA: Diagnosis not present

## 2021-02-27 DIAGNOSIS — F411 Generalized anxiety disorder: Secondary | ICD-10-CM | POA: Diagnosis not present

## 2021-02-27 DIAGNOSIS — F3132 Bipolar disorder, current episode depressed, moderate: Secondary | ICD-10-CM | POA: Diagnosis not present

## 2021-02-27 DIAGNOSIS — Z79899 Other long term (current) drug therapy: Secondary | ICD-10-CM | POA: Diagnosis not present

## 2021-03-27 DIAGNOSIS — R946 Abnormal results of thyroid function studies: Secondary | ICD-10-CM | POA: Diagnosis not present

## 2021-03-27 DIAGNOSIS — Z79899 Other long term (current) drug therapy: Secondary | ICD-10-CM | POA: Diagnosis not present

## 2021-04-10 DIAGNOSIS — G8929 Other chronic pain: Secondary | ICD-10-CM | POA: Diagnosis not present

## 2021-04-10 DIAGNOSIS — F3132 Bipolar disorder, current episode depressed, moderate: Secondary | ICD-10-CM | POA: Diagnosis not present

## 2021-04-10 DIAGNOSIS — Z79899 Other long term (current) drug therapy: Secondary | ICD-10-CM | POA: Diagnosis not present

## 2021-04-10 DIAGNOSIS — F411 Generalized anxiety disorder: Secondary | ICD-10-CM | POA: Diagnosis not present

## 2021-04-10 DIAGNOSIS — I10 Essential (primary) hypertension: Secondary | ICD-10-CM | POA: Diagnosis not present

## 2023-05-19 NOTE — Progress Notes (Unsigned)
Referring Physician:  Center, [facility] [address] [place],  [place] [address]  Primary Physician:  Center, [facility]  History of Present Illness: 05/20/2023 Ms. Rachel Morse is a 57 year old with a past medical history of obesity, lumbar stenosis, multiple cervical and lumbar surgeries, anxiety, COPD, hypertension, chronic pain, who is here today with a chief complaint of neck pain that will radiate into the left arm and will stop at her mid forearm.  She reports a history of falls for many years and attributes this to her ongoing low back pain however she has had an increase in fall over the last several months with the most recent being on 05/01/2023.  She states that she fell over her recliner and landed on her left neck and shoulder on a dog crate.  She has had severe neck and left shoulder pain that she describes as burning that radiates down to her mid forearm.  She denies any numbness or tingling in her hands.  She states that she has pain with range of motion of her left arm that will occasionally radiate across to her right shoulder but denies any pain with range of motion of her right arm or pain that radiates down her right arm.  She is currently taking oxycodone as prescribed by her primary care provider and is attempted Voltaren gel, Tylenol, and ibuprofen without significant relief.  She has not seen any medical providers or had any imaging since her fall earlier this month.  Past Surgery: 03/13/19: C5-6 ACDF by Dr. Dionisio David has no symptoms of cervical myelopathy.  The symptoms are causing a significant impact on the patient's life.   Review of Systems:  A 10 point review of systems is negative, except for the pertinent positives and negatives detailed in the HPI.  Past Medical History: Past Medical History:  Diagnosis Date   Anxiety    Arthritis    Complication of anesthesia    COPD (chronic obstructive pulmonary disease)  (HCC)    Dysrhythmia    slow heart rate   Hypertension    Panic attacks    PONV (postoperative nausea and vomiting)     Past Surgical History: Past Surgical History:  Procedure Laterality Date   ABDOMINAL HYSTERECTOMY     ANTERIOR CERVICAL DECOMP/DISCECTOMY FUSION N/A 03/13/2019   Procedure: ANTERIOR CERVICAL DECOMPRESSION/DISCECTOMY FUSION 1 LEVEL C5-6;  Surgeon: Lucy Chris, MD;  Location: [facility] ORS;  Service: Neurosurgery;  Laterality: N/A;   BACK SURGERY     x 3   COLONOSCOPY WITH PROPOFOL N/A 06/05/2019   Procedure: COLONOSCOPY WITH PROPOFOL;  Surgeon: Toney Reil, MD;  Location: [facility] ENDOSCOPY;  Service: Gastroenterology;  Laterality: N/A;   NECK SURGERY     TONSILLECTOMY      Allergies: Allergies as of 05/20/2023 - Review Complete 06/23/2019  Allergen Reaction Noted   Iodinated contrast media Shortness Of Breath 01/31/2019   Latex Swelling 02/24/2019   Tape Dermatitis 07/20/2015    Medications: Outpatient Encounter Medications as of 05/20/2023  Medication Sig   albuterol (PROVENTIL HFA;VENTOLIN HFA) 108 (90 Base) MCG/ACT inhaler Inhale 2-4 puffs by mouth every 4 hours as needed for wheezing, cough, and/or shortness of breath   ALPRAZolam (XANAX) 0.5 MG tablet Take 0.5 mg by mouth 3 (three) times daily.   amLODipine (NORVASC) 5 MG tablet Take 5 mg by mouth daily.   ARIPiprazole (ABILIFY) 15 MG tablet Take 15 mg by mouth daily.   hydrochlorothiazide (MICROZIDE) 12.5 MG  capsule Take 12.5 mg by mouth daily.   levothyroxine (SYNTHROID) 100 MCG tablet Take 100 mcg by mouth daily before breakfast.   methocarbamol (ROBAXIN) 500 MG tablet Take 1 tablet (500 mg total) by mouth every 6 (six) hours as needed for muscle spasms.   oxyCODONE-acetaminophen (PERCOCET) 10-325 MG tablet Take 1 tablet by mouth every 8 (eight) hours as needed for pain.   promethazine (PHENERGAN) 25 MG tablet Take 25 mg by mouth every 8 (eight) hours as needed for nausea or vomiting.   traZODone  (DESYREL) 100 MG tablet Take 100 mg by mouth at bedtime.   No facility-administered encounter medications on file as of 05/20/2023.    Social History: Social History   Tobacco Use   Smoking status: Former    Current packs/day: 0.00    Types: Cigarettes    Quit date: 02/05/2019    Years since quitting: 4.2   Smokeless tobacco: Never  Vaping Use   Vaping status: Never Used  Substance Use Topics   Alcohol use: No   Drug use: No    Family Medical History: Family History  Problem Relation Age of Onset   COPD Mother    Stroke Mother    Asthma Father    Heart disease Father    Breast cancer Paternal Aunt        late 10   Breast cancer Paternal Grandmother        32's    Physical Examination: Today's Vitals   05/20/23 1101  BP: 134/78  Weight: 133.6 kg  Height: 5\' 7"  (1.702 m)  PainSc: 6   PainLoc: Neck   Body mass index is 46.14 kg/m.   General: Patient is well developed, obese, calm, collected, and but mildly anxious. Attention to examination is appropriate.  Psychiatric: anxious  Head:  Pupils equal, round, and reactive to light.  ENT:  Oral mucosa appears well hydrated.  Neck:   Supple.    Respiratory: Patient is breathing without any difficulty.  Extremities: No edema.  Vascular: Palpable dorsal pedal pulses.  Skin:   On exposed skin, there are no abnormal skin lesions.  NEUROLOGICAL:     Awake, alert, oriented to person, place, and time.  Speech is clear and fluent. Fund of knowledge is appropriate.   Cranial Nerves: Pupils equal round and reactive to light.  Facial tone is symmetric.  Facial sensation is symmetric.  ROM of spine: limited ROM of neck due to pain  Palpation of spine: TTP throughout cervical spine.  There is no obvious step-offs.  Strength: Side Biceps Triceps Deltoid Interossei Grip Wrist Ext. Wrist Flex.  R 5 5 5 5 5 5 5   L 5 5 4- 5 5 5 5    Side Iliopsoas Quads Hamstring PF DF EHL  R 5 5 5 5 5 5   L 5 5 5 5 5 5    Reflexes  are 2+ and symmetric at the biceps, triceps, brachioradialis, patella and achilles.   Hoffman's is absent.  Chronic numbness in left leg otherwise sensation largely intact.  MSK: Pain with active and passive ROM of the left shoulder   Medical Decision Making  Imaging: No recent imaging to review  Assessment and Plan: Ms. Fayson is a pleasant 57 y.o. female with neck and left shoulder pain after a fall a couple of weeks ago.  Her exam is concerning for underlying shoulder injury given her limited range of motion and pain with manipulation of the joint.  I have ordered left shoulder xrays, Given  her history of previous cervical surgery, neck pain, and mechanism of injury, we will obtain cervical x-rays to evaluate for any fracture or hardware complications.  Should her x-rays to be largely without acute findings and her symptoms persist, we discussed obtaining cervical MRI to evaluate further.  I have given her a prescription for Robaxin to take in the meantime. During the patients departure from clinic, it was noted that there may have been bed bugs on the patients clothing. She was called and notified and clinic protocols were followed. She was called and instructed not to go to xray. She is aware that she will need treatment and medication of this prior to returning to clinic.   Thank you for involving me in the care of this patient.   I spent a total of 51 minutes in both face-to-face and non-face-to-face activities for this visit on the date of this encounter including review review of outside records, discussion of symptoms, physical exam, discussion of differential diagnosis, order placement, and documentation.   [facility]

## 2023-05-20 ENCOUNTER — Ambulatory Visit: Payer: 59 | Admitting: Neurosurgery

## 2023-05-20 ENCOUNTER — Encounter: Payer: Self-pay | Admitting: Neurosurgery

## 2023-05-20 VITALS — BP 134/78 | Ht 67.0 in | Wt 294.6 lb

## 2023-05-20 DIAGNOSIS — M25512 Pain in left shoulder: Secondary | ICD-10-CM

## 2023-05-20 DIAGNOSIS — M542 Cervicalgia: Secondary | ICD-10-CM | POA: Diagnosis not present

## 2023-05-20 DIAGNOSIS — W19XXXA Unspecified fall, initial encounter: Secondary | ICD-10-CM

## 2023-05-20 MED ORDER — METHOCARBAMOL 500 MG PO TABS: 500.0000 mg | ORAL_TABLET | Freq: Three times a day (TID) | ORAL | 0 refills | Status: AC | PRN
